# Patient Record
Sex: Female | Born: 1975 | Race: Black or African American | Hispanic: No | Marital: Married | Smoking: Current every day smoker
Health system: Southern US, Community
[De-identification: ages and names within clinical notes are randomized; demographics above are authoritative.]

## PROBLEM LIST (undated history)

## (undated) HISTORY — PX: ROTATOR CUFF REPAIR: SHX139

## (undated) HISTORY — PX: TUBAL LIGATION: SHX77

---

## 1997-12-08 ENCOUNTER — Inpatient Hospital Stay (HOSPITAL_COMMUNITY): Admission: AD | Admit: 1997-12-08 | Discharge: 1997-12-08 | Payer: Self-pay | Admitting: Obstetrics

## 1998-01-27 ENCOUNTER — Inpatient Hospital Stay (HOSPITAL_COMMUNITY): Admission: AD | Admit: 1998-01-27 | Discharge: 1998-01-27 | Payer: Self-pay | Admitting: Obstetrics & Gynecology

## 1998-06-15 ENCOUNTER — Inpatient Hospital Stay (HOSPITAL_COMMUNITY): Admission: AD | Admit: 1998-06-15 | Discharge: 1998-06-15 | Payer: Self-pay | Admitting: *Deleted

## 1998-06-21 ENCOUNTER — Inpatient Hospital Stay (HOSPITAL_COMMUNITY): Admission: AD | Admit: 1998-06-21 | Discharge: 1998-06-21 | Payer: Self-pay | Admitting: *Deleted

## 1998-07-02 ENCOUNTER — Inpatient Hospital Stay (HOSPITAL_COMMUNITY): Admission: AD | Admit: 1998-07-02 | Discharge: 1998-07-04 | Payer: Self-pay | Admitting: Obstetrics

## 1999-04-06 ENCOUNTER — Emergency Department (HOSPITAL_COMMUNITY): Admission: EM | Admit: 1999-04-06 | Discharge: 1999-04-06 | Payer: Self-pay | Admitting: Emergency Medicine

## 1999-08-11 ENCOUNTER — Emergency Department (HOSPITAL_COMMUNITY): Admission: EM | Admit: 1999-08-11 | Discharge: 1999-08-11 | Payer: Self-pay | Admitting: Emergency Medicine

## 2000-09-14 ENCOUNTER — Emergency Department (HOSPITAL_COMMUNITY): Admission: EM | Admit: 2000-09-14 | Discharge: 2000-09-14 | Payer: Self-pay | Admitting: *Deleted

## 2000-11-26 ENCOUNTER — Emergency Department (HOSPITAL_COMMUNITY): Admission: EM | Admit: 2000-11-26 | Discharge: 2000-11-26 | Payer: Self-pay | Admitting: *Deleted

## 2000-12-04 ENCOUNTER — Emergency Department (HOSPITAL_COMMUNITY): Admission: EM | Admit: 2000-12-04 | Discharge: 2000-12-04 | Payer: Self-pay | Admitting: Emergency Medicine

## 2000-12-04 ENCOUNTER — Encounter: Payer: Self-pay | Admitting: Emergency Medicine

## 2001-12-14 ENCOUNTER — Emergency Department (HOSPITAL_COMMUNITY): Admission: EM | Admit: 2001-12-14 | Discharge: 2001-12-15 | Payer: Self-pay

## 2003-01-21 ENCOUNTER — Emergency Department (HOSPITAL_COMMUNITY): Admission: EM | Admit: 2003-01-21 | Discharge: 2003-01-21 | Payer: Self-pay | Admitting: Emergency Medicine

## 2003-01-21 ENCOUNTER — Encounter: Payer: Self-pay | Admitting: Emergency Medicine

## 2003-07-10 ENCOUNTER — Emergency Department (HOSPITAL_COMMUNITY): Admission: EM | Admit: 2003-07-10 | Discharge: 2003-07-10 | Payer: Self-pay | Admitting: Emergency Medicine

## 2003-09-06 ENCOUNTER — Inpatient Hospital Stay (HOSPITAL_COMMUNITY): Admission: AD | Admit: 2003-09-06 | Discharge: 2003-09-06 | Payer: Self-pay | Admitting: Obstetrics & Gynecology

## 2003-12-26 ENCOUNTER — Inpatient Hospital Stay (HOSPITAL_COMMUNITY): Admission: AD | Admit: 2003-12-26 | Discharge: 2003-12-28 | Payer: Self-pay | Admitting: Family Medicine

## 2005-09-28 ENCOUNTER — Emergency Department (HOSPITAL_COMMUNITY): Admission: EM | Admit: 2005-09-28 | Discharge: 2005-09-28 | Payer: Self-pay | Admitting: Emergency Medicine

## 2006-04-09 ENCOUNTER — Inpatient Hospital Stay (HOSPITAL_COMMUNITY): Admission: AD | Admit: 2006-04-09 | Discharge: 2006-04-10 | Payer: Self-pay | Admitting: Family Medicine

## 2006-05-27 ENCOUNTER — Ambulatory Visit (HOSPITAL_COMMUNITY): Admission: RE | Admit: 2006-05-27 | Discharge: 2006-05-27 | Payer: Self-pay | Admitting: Family Medicine

## 2006-07-03 ENCOUNTER — Ambulatory Visit: Payer: Self-pay | Admitting: Internal Medicine

## 2006-08-11 ENCOUNTER — Ambulatory Visit: Payer: Self-pay | Admitting: Neonatology

## 2006-08-11 ENCOUNTER — Ambulatory Visit: Payer: Self-pay | Admitting: Obstetrics & Gynecology

## 2006-08-11 ENCOUNTER — Inpatient Hospital Stay (HOSPITAL_COMMUNITY): Admission: AD | Admit: 2006-08-11 | Discharge: 2006-08-17 | Payer: Self-pay | Admitting: Obstetrics & Gynecology

## 2006-08-15 ENCOUNTER — Encounter (INDEPENDENT_AMBULATORY_CARE_PROVIDER_SITE_OTHER): Payer: Self-pay | Admitting: Specialist

## 2008-06-13 ENCOUNTER — Emergency Department (HOSPITAL_COMMUNITY): Admission: EM | Admit: 2008-06-13 | Discharge: 2008-06-13 | Payer: Self-pay | Admitting: Emergency Medicine

## 2008-06-15 ENCOUNTER — Emergency Department (HOSPITAL_COMMUNITY): Admission: EM | Admit: 2008-06-15 | Discharge: 2008-06-15 | Payer: Self-pay | Admitting: Emergency Medicine

## 2008-06-16 ENCOUNTER — Emergency Department (HOSPITAL_COMMUNITY): Admission: EM | Admit: 2008-06-16 | Discharge: 2008-06-16 | Payer: Self-pay | Admitting: Emergency Medicine

## 2009-03-28 ENCOUNTER — Emergency Department (HOSPITAL_COMMUNITY): Admission: EM | Admit: 2009-03-28 | Discharge: 2009-03-28 | Payer: Self-pay | Admitting: Emergency Medicine

## 2009-03-29 ENCOUNTER — Emergency Department (HOSPITAL_COMMUNITY): Admission: EM | Admit: 2009-03-29 | Discharge: 2009-03-29 | Payer: Self-pay | Admitting: Emergency Medicine

## 2010-12-22 NOTE — Op Note (Signed)
NAME:  Emily Kane, BLOMBERG                ACCOUNT NO.:  [id number]   MEDICAL RECORD NO.:  [id number]          PATIENT TYPE:  INP   LOCATION:  9127                          FACILITY:  [facility]   PHYSICIAN:  Allie Bossier, MD        DATE OF BIRTH:  Jun 17, 1976   DATE OF PROCEDURE:  08/16/2006  DATE OF DISCHARGE:                               OPERATIVE REPORT   PREOPERATIVE DIAGNOSES:  1. Multiparity.  2. Desires permanent sterilization.   POSTOPERATIVE DIAGNOSES:  1. Multiparity.  2. Desires permanent sterilization.   PROCEDURE:  Postpartum bilateral tubal ligation with Filshie clips.   SURGEON:  Allie Bossier, MD   ASSISTANT:  Paticia Stack, MD   ANESTHESIA:  Epidural and local.   COMPLICATIONS:  None.   ESTIMATED BLOOD LOSS:  Minimal.   INDICATIONS FOR PROCEDURE:  This is a 35 year old gravida 5, para 3-1-1-  4, status post spontaneous vaginal delivery, who desires permanent  sterilization.  Risks and benefits of procedure were discussed with the  patient including risk of failure with increased risk of ectopic  gestation if pregnancy occurs.   FINDINGS:  Normal uterus, tubes and ovaries.   PROCEDURE:  The patient was taken to the operating room, where her  epidural was found to be adequate.  A small transverse infraumbilical  skin incision was then made with a scalpel.  The incision was carried  down through the underlying fascia until the peritoneum was identified  and entered.  The peritoneum was noted to be free of any adhesions and  the incision was then extended with the Metzenbaum scissors.  The  patient's right fallopian she was then identified, brought to the  incision and grasped with the Babcock clamp.  The tube was followed out  to the fimbria.  Two Babcock clamps were then used to grasp the tube and  a Filshie clip was placed in the isthmic region of the tube.  About 0.5  mL of 1% lidocaine was injected into the tube on either side of the  Filshie clips.  Excellent  hemostasis was noted and the tube was returned  to the abdomen.  The left fallopian tube was then identified, brought to  the incision and grasped with the Babcock clamps.  The tube was then  followed out to the fimbria.  Two Babcock clamps was then used to grasp  the tube and a Filshie clip placed between the 2 clamps.  0.5 mL of 1%  lidocaine was injected into the tube on either side of the Filshie clip.  Excellent hemostasis was noted and this tube returned to the  abdomen.  The fascia was then closed in a single layer using 3-0 Vicryl.  The skin was closed in a subcuticular fashion using 3-0 Vicryl on a PS2.  The patient tolerated the procedure well.  Sponge, lap and needle counts  were correct x2.  The patient was taken to the recovery room in stable  condition.     ______________________________  Paticia Stack, MD      Allie Bossier, MD  Electronically  Signed    LNJ/MEDQ  D:  08/16/2006  T:  08/16/2006  Job:  [id number]

## 2010-12-22 NOTE — Assessment & Plan Note (Signed)
[facility]                             PULMONARY OFFICE NOTE   Emily, Kane                       MRN:          [id number]  DATE:07/03/2006                            DOB:          [birth year]    REFERRING PHYSICIAN:  Conni Elliot, M.D.   PULMONARY CONSULTATION:   REASON FOR CONSULTATION:  Asthma during pregnancy.   HISTORY:  This 35 year old black female says she has had asthma since  the age of 15, but typically does not need albuterol more than twice  weekly, except when she is exposed to heavy dusts and irritants.  She  now comes in, [redacted] weeks pregnant on her third pregnancy, using albuterol  on the average of two to three times a week with the only thing  different from her previous pregnancies, noticing overt heartburn  symptoms.  She also has mild nasal congestion that she did not have  during her two previous pregnancies.  She denies any excess weight on  this pregnancy versus the other ones, or itching, sneezing, pleuritic or  exertional chest pain, orthopnea, PND or leg swelling.   PAST MEDICAL HISTORY:  Significant for asthma, as noted above, otherwise  healthy.   ALLERGIES:  None known.   MEDICATIONS:  Multivitamins daily and albuterol p.r.n.   SOCIAL HISTORY:  She continues to smoke six cigarettes per day, has also  been exposed to marijuana, describes herself as a homemaker, previously  has worked in Psychiatrist, but now is no longer exposed to irritating  fumes.   FAMILY HISTORY:  Positive for asthma in her two children and allergies  in her whole family.   REVIEW OF SYSTEMS:  Taken in detail on work sheets, again with the  problems outlined above.   PHYSICAL EXAMINATION:  This is a pleasant, ambulatory black female, in  no acute distress with stable vital signs.  HEENT:  Remarkable for minimal turbinate edema with nonspecific  features.  Oropharynx is clear with no evidence of excessive postnasal  drainage  or cobblestoning .  Dentition is intact.  The auditory canals  were clear bilaterally.  NECK:  The neck is supple without cervical adenopathy or tenderness.  Trachea is midline.  Nodes are negative.  LUNG:  Lung fields totally clear bilaterally to auscultation and  percussion.  HEART:  Regular rhythm without murmur, gallop or rub.  No increase in  P2.  ABDOMEN:  Soft, benign, consistent with an intrauterine pregnancy of [redacted]  weeks gestation.  EXTREMITIES:  Warm, without calf tenderness, cyanosis, clubbing or  edema.   Hemoglobin saturation is 100% on room air.   IMPRESSION:  Long-standing asthma with increasing symptoms at [redacted] weeks  gestation, probably partly related to reflux and partly related to  rhinitis, which actually may be, in turn, related to reflux or related  to pregnancy.  Because of the physical effect of the baby below the  diaphragm plus the effects of gestational hormones, she is prone to  reflux and to test the hypothesis of whether or not this is hurting her  asthma, I recommended continuing parenteral  p.r.n., but added Prevacid  30 minutes before meals daily for the next 30 days and avoidance of  cigarettes.  If this corrects the problem and she is able to follow the  rule of twos (which was reviewed with her in detail), we can see her  back here on a p.r.n. basis.  Otherwise, I need to see her in 30 days.   Additional time was spent teaching the patient the rule of twos and  also teaching her optimal MDI technique, which she mastered with  approximately 90% effectiveness.     Charlaine Dalton. Sherene Sires, MD, [facility]  Electronically Signed    MBW/MedQ  DD: 07/03/2006  DT: 07/03/2006  Job #: [id number]   cc:   Conni Elliot, M.D.

## 2011-05-08 LAB — CBC
MCHC: 33.2
RBC: 3.95
RDW: 13.8

## 2011-05-08 LAB — DIFFERENTIAL
Basophils Absolute: 0
Basophils Relative: 0
Monocytes Relative: 8
Neutro Abs: 4
Neutrophils Relative %: 60

## 2011-11-30 ENCOUNTER — Encounter (HOSPITAL_COMMUNITY): Payer: Self-pay | Admitting: Emergency Medicine

## 2011-11-30 ENCOUNTER — Emergency Department (HOSPITAL_COMMUNITY)
Admission: EM | Admit: 2011-11-30 | Discharge: 2011-11-30 | Disposition: A | Discharge status: Home / Self Care | Payer: Worker's Compensation | Attending: Emergency Medicine | Admitting: Emergency Medicine

## 2011-11-30 ENCOUNTER — Emergency Department (HOSPITAL_COMMUNITY): Payer: Worker's Compensation

## 2011-11-30 DIAGNOSIS — M25519 Pain in unspecified shoulder: Secondary | ICD-10-CM | POA: Insufficient documentation

## 2011-11-30 DIAGNOSIS — J45909 Unspecified asthma, uncomplicated: Secondary | ICD-10-CM | POA: Insufficient documentation

## 2011-11-30 DIAGNOSIS — F17200 Nicotine dependence, unspecified, uncomplicated: Secondary | ICD-10-CM | POA: Insufficient documentation

## 2011-11-30 DIAGNOSIS — R209 Unspecified disturbances of skin sensation: Secondary | ICD-10-CM | POA: Diagnosis not present

## 2011-11-30 MED ORDER — HYDROCODONE-ACETAMINOPHEN 5-325 MG PO TABS: 1.0000 | ORAL_TABLET | ORAL | Status: AC | PRN

## 2011-11-30 MED ORDER — OXYCODONE-ACETAMINOPHEN 5-325 MG PO TABS
2.0000 | ORAL_TABLET | Freq: Once | ORAL | Status: AC
  Administered 2011-11-30: 2 via ORAL
  Filled 2011-11-30: qty 2

## 2011-11-30 NOTE — Discharge Instructions (Signed)
Your x-rays today do not show any signs for broken bones or dislocation to the shoulder. At this time your providers to go home and rest your shoulder. Please followup with primary care provider or orthopedic specialist for continued evaluation and treatment.   Shoulder Pain The shoulder is a ball and socket joint. The muscles and tendons (rotator cuff) are what keep the shoulder in its joint and stable. This collection of muscles and tendons holds in the head (ball) of the humerus (upper arm bone) in the fossa (cup) of the scapula (shoulder blade). Today no reason was found for your shoulder pain. Often pain in the shoulder may be treated conservatively with temporary immobilization. For example, holding the shoulder in one place using a sling for rest. Physical therapy may be needed if problems continue. HOME CARE INSTRUCTIONS   Apply ice to the sore area for 15 to 20 minutes, 3 to 4 times per day for the first 2 days. Put the ice in a plastic bag. Place a towel between the bag of ice and your skin.   If you have or were given a shoulder sling and straps, do not remove for as long as directed by your caregiver or until you see a caregiver for a follow-up examination. If you need to remove it to shower or bathe, move your arm as little as possible.   Sleep on several pillows at night to lessen swelling and pain.   Only take over-the-counter or prescription medicines for pain, discomfort, or fever as directed by your caregiver.   Keep any follow-up appointments in order to avoid any type of permanent shoulder disability or chronic pain problems.  SEEK MEDICAL CARE IF:   Pain in your shoulder increases or new pain develops in your arm, hand, or fingers.   Your hand or fingers are colder than your other hand.   You do not obtain pain relief with the medications or your pain becomes worse.  SEEK IMMEDIATE MEDICAL CARE IF:   Your arm, hand, or fingers are numb or tingling.   Your arm, hand, or  fingers are swollen, painful, or turn white or blue.   You develop chest pain or shortness of breath.  MAKE SURE YOU:   Understand these instructions.   Will watch your condition.   Will get help right away if you are not doing well or get worse.  Document Released: 05/02/2005 Document Revised: 07/12/2011 Document Reviewed: 07/07/2011 [facility] Patient Information 2012 [place], [place].    RESOURCE GUIDE  Dental Problems  Patients with Medicaid: [facility] [phone] [address]                                           [phone] [address] Phone:  [phone]                                                  Phone:  [phone]  If unable to pay or uninsured, contact:  [facility] or [facility]. to become qualified for the adult dental clinic.  Chronic Pain Problems Contact [facility]  [phone] Patients need to be referred by their primary care doctor.  Insufficient Money for Medicine Contact [employer] Way:  call "211" or [facility] (985)525-7479.  No Primary Care Doctor Call [facility]  [phone] Other agencies that provide inexpensive medical care    [facility]  [phone]    [facility]  [phone]    [facility]  6707776402    [facility]  [phone]    [facility]  [phone]    [facility]  [phone]  Psychological Services [facility] Behavioral Health  [phone] [facility] Services  [phone] [facility]   7157202168 (emergency services [phone])  Substance Abuse Resources [facility]  [phone] [facility] [phone] [facility] [phone] Floydene Flock [phone] Residential & Outpatient Substance Abuse Program  ([phone]  Abuse/Neglect [facility] Child Abuse Hotline 304-761-2644 [facility] Child Abuse Hotline (626)207-4936 (After  Hours)  Emergency Shelter [facility] 4062945462  Maternity Homes [facility] at [facility] [place] [facility] [phone])362-3301 [facility] [phone]  MRSA Hotline #:   (825)091-2860    [facility] Resources  [facility]     [employer]                          [facility] Dept. [address] [place]                       [address]      [address]  Blondell Reveal Phone:  [phone]                                   Phone:  [phone]                 Phone:  [phone]  [facility] Mental Health Phone:  [phone]  [facility] Child Abuse Hotline (415) [phone] (After Hours)

## 2011-11-30 NOTE — ED Provider Notes (Signed)
History     CSN: [id number]  Arrival date & time 11/30/11  0038   First MD Initiated Contact with Patient 11/30/11 0112      Chief Complaint  Patient presents with  . Shoulder Pain    HPI  History provided by the patient. Patient is a 36 year old female with history of asthma presents complaints of right shoulder pain has been persistent and increasing for the past several months. Pain is worse with certain movements. Patient denies any significant trauma or injury. She has tried using over-the-counter pain medications without significant relief of symptoms. Pain does feel better with rest. Patient also reports pain when rolling of sleeping on side. SHe does report occasional numbness or tingling in hand. Denies neck pain or stiffness. No other aggravating or alleviating factors.   Past Medical History  Diagnosis Date  . Asthma     Past Surgical History  Procedure Date  . Tubal ligation     History reviewed. No pertinent family history.  History  Substance Use Topics  . Smoking status: Current Everyday Smoker -- 0.5 packs/day  . Smokeless tobacco: Not on file  . Alcohol Use: Yes     occasion    OB History    Grav Para Term Preterm Abortions TAB SAB Ect Mult Living                  Review of Systems  Constitutional: Negative for fever and chills.  HENT: Negative for neck pain and neck stiffness.   Gastrointestinal: Negative for nausea and vomiting.  Musculoskeletal: Negative for myalgias, back pain and joint swelling.  Neurological: Positive for numbness. Negative for weakness.    Allergies  Review of patient's allergies indicates no known allergies.  Home Medications  No current outpatient prescriptions on file.  BP 92/50  Pulse 78  Temp(Src) 97.5 F (36.4 C) (Oral)  Resp 16  Ht 5\' 9"  (1.753 m)  Wt 155 lb (70.308 kg)  BMI 22.89 kg/m2  SpO2 98%  LMP 11/07/2011  Physical Exam  Nursing note and vitals reviewed. Constitutional: She is oriented to  person, place, and time. She appears well-developed and well-nourished. No distress.  HENT:  Head: Normocephalic and atraumatic.  Neck: Normal range of motion. Neck supple.       No cervical midline tenderness.  Cardiovascular: Normal rate and regular rhythm.   Pulmonary/Chest: Effort normal and breath sounds normal. No respiratory distress. She has no wheezes. She has no rales.  Abdominal: Soft.  Musculoskeletal: She exhibits tenderness. She exhibits no edema.       Reduced ROM secondary to pain, especially with abduction.  Normal distal radial pulses and cap refill in fingers.    Neurological: She is alert and oriented to person, place, and time.  Skin: Skin is warm and dry. No rash noted.  Psychiatric: She has a normal mood and affect. Her behavior is normal.    ED Course  Procedures   Dg Shoulder Right  11/30/2011  *RADIOLOGY REPORT*  Clinical Data: Right shoulder pain for 4 months.  No injury.  RIGHT SHOULDER - 2+ VIEW  Comparison: None.  Findings: The right shoulder appears intact.  No evidence of acute fracture or subluxation.  No focal bone lesion or bone destruction. No abnormal periosteal reaction.  IMPRESSION: No acute bony abnormalities appreciated.  Original Report Authenticated By: Marlon Pel, M.D.     1. Shoulder pain       MDM  1:20 AM patient seen and evaluated. Patient no  acute distress.        Angus Seller, [place] 11/30/11 [phone]

## 2011-11-30 NOTE — ED Notes (Signed)
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

## 2011-11-30 NOTE — ED Provider Notes (Signed)
Medical screening examination/treatment/procedure(s) were performed by non-physician practitioner and as supervising physician I was immediately available for consultation/collaboration.   Yenty Bloch L Donielle Radziewicz, MD 11/30/11 0736 

## 2012-04-21 ENCOUNTER — Encounter (HOSPITAL_COMMUNITY): Payer: Self-pay | Admitting: Family Medicine

## 2012-04-21 ENCOUNTER — Emergency Department (HOSPITAL_COMMUNITY)
Admission: EM | Admit: 2012-04-21 | Discharge: 2012-04-22 | Discharge status: Against Medical Advice | Payer: Worker's Compensation | Attending: Emergency Medicine | Admitting: Emergency Medicine

## 2012-04-21 DIAGNOSIS — Z202 Contact with and (suspected) exposure to infections with a predominantly sexual mode of transmission: Secondary | ICD-10-CM | POA: Insufficient documentation

## 2012-04-21 LAB — URINALYSIS, ROUTINE W REFLEX MICROSCOPIC
Leukocytes, UA: NEGATIVE
Protein, ur: NEGATIVE mg/dL
Urobilinogen, UA: 2 mg/dL — ABNORMAL HIGH (ref 0.0–1.0)

## 2012-04-21 LAB — PREGNANCY, URINE: Preg Test, Ur: NEGATIVE

## 2012-04-21 NOTE — ED Notes (Signed)
Pt sts that her husband believes she gave him a STDs today, but pt sts she did have bacteria vaginosis this past feb.  Pt sts though she does have abd pain after intercourse.

## 2012-04-21 NOTE — ED Notes (Signed)
Pt states her husband thinks she gave him an STD. Pt states she has not been sleeping with anyone else and denies having any kind of vaginal discharge, itching, or sx of STD. States she just wants to get checked to prove a point.

## 2012-04-22 NOTE — ED Notes (Signed)
Pt called x 1 for room. No response from pt.

## 2012-05-13 ENCOUNTER — Encounter (HOSPITAL_COMMUNITY): Payer: Self-pay | Admitting: Emergency Medicine

## 2012-05-13 ENCOUNTER — Emergency Department (HOSPITAL_COMMUNITY)
Admission: EM | Admit: 2012-05-13 | Discharge: 2012-05-13 | Disposition: A | Discharge status: Home / Self Care | Payer: Worker's Compensation | Attending: Emergency Medicine | Admitting: Emergency Medicine

## 2012-05-13 DIAGNOSIS — F17200 Nicotine dependence, unspecified, uncomplicated: Secondary | ICD-10-CM | POA: Diagnosis not present

## 2012-05-13 DIAGNOSIS — M25552 Pain in left hip: Secondary | ICD-10-CM

## 2012-05-13 DIAGNOSIS — J45909 Unspecified asthma, uncomplicated: Secondary | ICD-10-CM | POA: Insufficient documentation

## 2012-05-13 DIAGNOSIS — M25559 Pain in unspecified hip: Secondary | ICD-10-CM | POA: Diagnosis not present

## 2012-05-13 LAB — CBC
HCT: 38.8 % (ref 36.0–46.0)
Hemoglobin: 13.4 g/dL (ref 12.0–15.0)
MCH: 30.9 pg (ref 26.0–34.0)
MCHC: 34.5 g/dL (ref 30.0–36.0)
MCV: 89.4 fL (ref 78.0–100.0)
Platelets: 255 10*3/uL (ref 150–400)
RBC: 4.34 MIL/uL (ref 3.87–5.11)
RDW: 13.5 % (ref 11.5–15.5)
WBC: 6.6 10*3/uL (ref 4.0–10.5)

## 2012-05-13 LAB — WET PREP, GENITAL
Clue Cells Wet Prep HPF POC: NONE SEEN
Trich, Wet Prep: NONE SEEN
Yeast Wet Prep HPF POC: NONE SEEN

## 2012-05-13 LAB — COMPREHENSIVE METABOLIC PANEL
Albumin: 4 g/dL (ref 3.5–5.2)
BUN: 9 mg/dL (ref 6–23)
Chloride: 99 mEq/L (ref 96–112)
Creatinine, Ser: 0.81 mg/dL (ref 0.50–1.10)
GFR calc Af Amer: >90 mL/min (ref >90)
Total Bilirubin: 0.5 mg/dL (ref 0.3–1.2)
Total Protein: 7.7 g/dL (ref 6.0–8.3)

## 2012-05-13 LAB — COMPREHENSIVE METABOLIC PANEL WITH GFR
ALT: 15 U/L (ref 0–35)
AST: 15 U/L (ref 0–37)
Alkaline Phosphatase: 56 U/L (ref 39–117)
CO2: 27 meq/L (ref 19–32)
Calcium: 9.5 mg/dL (ref 8.4–10.5)
GFR calc non Af Amer: >90 mL/min (ref >90)
Glucose, Bld: 88 mg/dL (ref 70–99)
Potassium: 4.2 meq/L (ref 3.5–5.1)
Sodium: 134 meq/L — ABNORMAL LOW (ref 135–145)

## 2012-05-13 LAB — URINALYSIS, ROUTINE W REFLEX MICROSCOPIC
Bilirubin Urine: NEGATIVE
Glucose, UA: NEGATIVE mg/dL
Hgb urine dipstick: NEGATIVE
Ketones, ur: NEGATIVE mg/dL
Nitrite: NEGATIVE
Protein, ur: NEGATIVE mg/dL
Specific Gravity, Urine: 1.022 (ref 1.005–1.030)
Urobilinogen, UA: 1 mg/dL (ref 0.0–1.0)
pH: 6.5 (ref 5.0–8.0)

## 2012-05-13 LAB — URINE MICROSCOPIC-ADD ON

## 2012-05-13 MED ORDER — HYDROCODONE-ACETAMINOPHEN 5-325 MG PO TABS
2.0000 | ORAL_TABLET | Freq: Once | ORAL | Status: AC
  Administered 2012-05-13: 2 via ORAL
  Filled 2012-05-13: qty 2

## 2012-05-13 MED ORDER — DIAZEPAM 5 MG PO TABS: 5.0000 mg | ORAL_TABLET | Freq: Three times a day (TID) | ORAL | Status: DC | PRN

## 2012-05-13 MED ORDER — KETOROLAC TROMETHAMINE 60 MG/2ML IM SOLN
60.0000 mg | Freq: Once | INTRAMUSCULAR | Status: AC
  Administered 2012-05-13: 60 mg via INTRAMUSCULAR
  Filled 2012-05-13: qty 2

## 2012-05-13 MED ORDER — IBUPROFEN 600 MG PO TABS: 600.0000 mg | ORAL_TABLET | Freq: Three times a day (TID) | ORAL | Status: DC

## 2012-05-13 MED ORDER — HYDROCODONE-ACETAMINOPHEN 5-325 MG PO TABS: ORAL_TABLET | ORAL | Status: DC

## 2012-05-13 NOTE — ED Notes (Signed)
Pt stated that around 4am this morning she started having pain in pelvic area then in left groin area that then radiated to right groin area. Pt works in [employer] and got to work around [employer] and pain has intensified ever since. Pain rate 10/10

## 2012-05-13 NOTE — ED Provider Notes (Signed)
History     CSN: [id number]  Arrival date & time 05/13/12  1329   First MD Initiated Contact with Patient 05/13/12 1528      Chief Complaint  Patient presents with  . 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  Worked fine yesterday.  This early morning when she went back to work, she realized severe pain to groin, both hips worse with movement.  Also with sitting down, had pain going down buttocks.  She denies fall or direct trauma.  No numbness, tingling.  No fevers.  Denies hematuria, dysuria, hesitancy, freq.  Slight vaginal discharge, white, that she didn't fine unusual.  Pt has had 2 menses monthly since September, but has not sought help regarding this.  She is sexually active, doesn't use birth control.  Otherwise healthy.  Has had normal PAP smears in the past. Smokes, drinks alcohol fairly regularly, no drug use.  Has not taken anything specific for pain.    Patient is a 36 y.o. female presenting with groin pain. The history is provided by the patient and a relative.  Groin Pain    Past Medical History  Diagnosis Date  . Asthma     Past Surgical History  Procedure Date  . Tubal ligation     History reviewed. No pertinent family history.  History  Substance Use Topics  . Smoking status: Current Every Day Smoker -- 1.0 packs/day    Types: Cigarettes  . Smokeless tobacco: Not on file  . Alcohol Use: Yes     occasion    OB History    Grav Para Term Preterm Abortions TAB SAB Ect Mult Living                  Review of Systems  Constitutional: Negative for fever and chills.  Gastrointestinal: Negative for nausea, vomiting, diarrhea and constipation.  Genitourinary: Positive for vaginal discharge and menstrual problem. Negative for dysuria, frequency, flank pain, vaginal bleeding, difficulty urinating and vaginal pain.    Musculoskeletal: Positive for arthralgias. Negative for back pain and joint swelling.  All other systems reviewed and are negative.    Allergies  Review of patient's allergies indicates no known allergies.  Home Medications  No current outpatient prescriptions on file.  BP 90/62  Pulse 73  Temp 99.1 F (37.3 C) (Oral)  Resp 16  Ht 5\' 9"  (1.753 m)  Wt 150 lb (68.04 kg)  BMI 22.15 kg/m2  SpO2 100%  LMP 04/28/2012  Physical Exam  Nursing note and vitals reviewed. Constitutional: She is oriented to person, place, and time. She appears well-developed and well-nourished.  HENT:  Head: Normocephalic and atraumatic.  Eyes: Pupils are equal, round, and reactive to light. No scleral icterus.  Neck: Normal range of motion. Neck supple.  Cardiovascular: Normal rate and regular rhythm.   Pulmonary/Chest: Effort normal. No respiratory distress. She has no wheezes.  Abdominal: Soft. She exhibits no distension. There is no tenderness. There is no rebound. Hernia confirmed negative in the right inguinal area and confirmed negative in the left inguinal area.  Genitourinary: There is no lesion on the right labia. There is no lesion on the left labia. Uterus is not tender. Cervix exhibits no discharge and no friability. Right adnexum displays no tenderness. Left adnexum displays no tenderness. No erythema or tenderness around the vagina. No foreign body around the vagina. Vaginal discharge found.  Chaperone present  Musculoskeletal: She exhibits tenderness.       Pelvis is stable, not tender to compression.  Pain at both hips with rotation of both hips as well as flexion.  NO visible deformity, no hernias  Neurological: She is alert and oriented to person, place, and time.  Skin: Skin is warm and dry. No rash noted. No erythema.    ED Course  Procedures (including critical care time)  Labs Reviewed  URINALYSIS, ROUTINE W REFLEX MICROSCOPIC - Abnormal; Notable for the following:     APPearance CLOUDY (*)     Leukocytes, UA TRACE (*)     All other components within normal limits  COMPREHENSIVE METABOLIC PANEL - Abnormal; Notable for the following:    Sodium 134 (*)     All other components within normal limits  WET PREP, GENITAL - Abnormal; Notable for the following:    WBC, Wet Prep HPF POC RARE (*)     All other components within normal limits  CBC  POCT PREGNANCY, URINE  URINE MICROSCOPIC-ADD ON  GC/CHLAMYDIA PROBE AMP, GENITAL   No results found.   1. Hip pain, bilateral     ra sat is 99% and in interpret to be normal    5:32 PM Wet prep is ok.  Cultures sent.  follow up discussed and return precautions discussed.  Pt feels somewhat improved after initial meds.    MDM  Pelvic exam is unremarkable.  Exam suggests musculoskeletal pain.  No fever.  No lumbar or back pain or tenderness on exam.  Will treat with analgesics, muscle relaxants.  Return precautions discussed.  Cultures sent.  No indication for films or U/S based on age, no red flags regarding neurological dysfunction.          Gavin Pound. Quanta Roher, MD 05/13/12 1733

## 2012-05-13 NOTE — Discharge Instructions (Signed)
 Arthralgia Your caregiver has diagnosed you as suffering from an arthralgia. Arthralgia means there is pain in a joint. This can come from many reasons including:  Bruising the joint which causes soreness (inflammation) in the joint.  Wear and tear on the joints which occur as we grow older (osteoarthritis).  Overusing the joint.  Various forms of arthritis.  Infections of the joint. Regardless of the cause of pain in your joint, most of these different pains respond to anti-inflammatory drugs and rest. The exception to this is when a joint is infected, and these cases are treated with antibiotics, if it is a bacterial infection. HOME CARE INSTRUCTIONS   Rest the injured area for as long as directed by your caregiver. Then slowly start using the joint as directed by your caregiver and as the pain allows. Crutches as directed may be useful if the ankles, knees or hips are involved. If the knee was splinted or casted, continue use and care as directed. If an stretchy or elastic wrapping bandage has been applied today, it should be removed and re-applied every 3 to 4 hours. It should not be applied tightly, but firmly enough to keep swelling down. Watch toes and feet for swelling, bluish discoloration, coldness, numbness or excessive pain. If any of these problems (symptoms) occur, remove the ace bandage and re-apply more loosely. If these symptoms persist, contact your caregiver or return to this location.  For the first 24 hours, keep the injured extremity elevated on pillows while lying down.  Apply ice for 15 to 20 minutes to the sore joint every couple hours while awake for the first half day. Then 3 to 4 times per day for the first 48 hours. Put the ice in a plastic bag and place a towel between the bag of ice and your skin.  Wear any splinting, casting, elastic bandage applications, or slings as instructed.  Only take over-the-counter or prescription medicines for pain, discomfort, or  fever as directed by your caregiver. Do not use aspirin immediately after the injury unless instructed by your physician. Aspirin can cause increased bleeding and bruising of the tissues.  If you were given crutches, continue to use them as instructed and do not resume weight bearing on the sore joint until instructed. Persistent pain and inability to use the sore joint as directed for more than 2 to 3 days are warning signs indicating that you should see a caregiver for a follow-up visit as soon as possible. Initially, a hairline fracture (break in bone) may not be evident on X-rays. Persistent pain and swelling indicate that further evaluation, non-weight bearing or use of the joint (use of crutches or slings as instructed), or further X-rays are indicated. X-rays may sometimes not show a small fracture until a week or 10 days later. Make a follow-up appointment with your own caregiver or one to whom we have referred you. A radiologist (specialist in reading X-rays) may read your X-rays. Make sure you know how you are to obtain your X-ray results. Do not assume everything is normal if you do not hear from us . SEEK MEDICAL CARE IF: Bruising, swelling, or pain increases. SEEK IMMEDIATE MEDICAL CARE IF:   Your fingers or toes are numb or blue.  The pain is not responding to medications and continues to stay the same or get worse.  The pain in your joint becomes severe.  You develop a fever over 102 F (38.9 C).  It becomes impossible to move or use the joint.  MAKE SURE YOU:   Understand these instructions.  Will watch your condition.  Will get help right away if you are not doing well or get worse. Document Released: 07/23/2005 Document Revised: 10/15/2011 Document Reviewed: 03/10/2008 [facility] Patient Information 2013 [place], [place].      Narcotic and benzodiazepine use may cause drowsiness, slowed breathing or dependence.  Please use with caution and do not drive, operate machinery  or watch young children alone while taking them.  Taking combinations of these medications or drinking alcohol will potentiate these effects.      PLEASE WATCH SYMPTOMS CAREFULLY.  IF ABDOMINAL PAIN, BACK PAIN, NUMBNESS OR WEAKNESS, FEVERS, VOMITING OR OTHER SIMILAR NEW SYMPTOMS ARE DEVELOPING, PLEASE FOLLOW UP WITH YOUR OWN PHYSICIAN OR RETURN TO THE EMERGENCY DEPARTMENT FOR RE-EVALUATION

## 2012-05-14 LAB — GC/CHLAMYDIA PROBE AMP, GENITAL
Chlamydia, DNA Probe: NEGATIVE
GC Probe Amp, Genital: NEGATIVE

## 2013-05-31 ENCOUNTER — Emergency Department (HOSPITAL_COMMUNITY)
Admission: EM | Admit: 2013-05-31 | Discharge: 2013-05-31 | Disposition: A | Discharge status: Home / Self Care | Payer: Worker's Compensation | Attending: Emergency Medicine | Admitting: Emergency Medicine

## 2013-05-31 ENCOUNTER — Encounter (HOSPITAL_COMMUNITY): Payer: Self-pay | Admitting: Emergency Medicine

## 2013-05-31 DIAGNOSIS — M719 Bursopathy, unspecified: Secondary | ICD-10-CM | POA: Insufficient documentation

## 2013-05-31 DIAGNOSIS — F17200 Nicotine dependence, unspecified, uncomplicated: Secondary | ICD-10-CM | POA: Insufficient documentation

## 2013-05-31 DIAGNOSIS — M25511 Pain in right shoulder: Secondary | ICD-10-CM

## 2013-05-31 DIAGNOSIS — Z791 Long term (current) use of non-steroidal anti-inflammatories (NSAID): Secondary | ICD-10-CM | POA: Insufficient documentation

## 2013-05-31 DIAGNOSIS — J45909 Unspecified asthma, uncomplicated: Secondary | ICD-10-CM | POA: Insufficient documentation

## 2013-05-31 DIAGNOSIS — M67919 Unspecified disorder of synovium and tendon, unspecified shoulder: Secondary | ICD-10-CM | POA: Diagnosis not present

## 2013-05-31 DIAGNOSIS — S46001S Unspecified injury of muscle(s) and tendon(s) of the rotator cuff of right shoulder, sequela: Secondary | ICD-10-CM

## 2013-05-31 DIAGNOSIS — M25519 Pain in unspecified shoulder: Secondary | ICD-10-CM | POA: Diagnosis present

## 2013-05-31 MED ORDER — OXYCODONE-ACETAMINOPHEN 5-325 MG PO TABS
2.0000 | ORAL_TABLET | Freq: Once | ORAL | Status: AC
  Administered 2013-05-31: 2 via ORAL
  Filled 2013-05-31: qty 2

## 2013-05-31 MED ORDER — OXYCODONE-ACETAMINOPHEN 5-325 MG PO TABS: 1.0000 | ORAL_TABLET | ORAL | Status: DC | PRN

## 2013-05-31 NOTE — ED Notes (Signed)
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 Pt is A&O and in NAD

## 2013-05-31 NOTE — ED Provider Notes (Addendum)
TIME SEEN: Right shoulder pain  CHIEF COMPLAINT: Right shoulder pain  HPI: Patient is a 37 y.o. female with a history of asthma and right rotator cuff tendinitis/tear who is being followed by Dr. Ranell Patrick with orthopedics who presents to the emergency department with acute worsening of her right shoulder pain that started yesterday. She states that she has had 2 steroid injections in her shoulder with good relief. She has a very strenuous job working in [employer] and reports she performs lots of lifting, pushing and pulling. Denies any injury that she can remember. She states her pain is burning and radiates throughout her shoulder, worse with movement, better with rest. No numbness, tingling or focal weakness. No fevers, joint swelling, redness or warmth.  ROS: See HPI Constitutional: no fever  Eyes: no drainage  ENT: no runny nose   Cardiovascular:  no chest pain  Resp: no SOB  GI: no vomiting GU: no dysuria Integumentary: no rash  Allergy: no hives  Musculoskeletal: no leg swelling  Neurological: no slurred speech ROS otherwise negative  PAST MEDICAL HISTORY/PAST SURGICAL HISTORY:  Past Medical History  Diagnosis Date  . Asthma     MEDICATIONS:  Prior to Admission medications   Medication Sig Start Date End Date Taking? Authorizing Provider  diazepam (VALIUM) 5 MG tablet Take 1 tablet (5 mg total) by mouth every 8 (eight) hours as needed (muscle spasm). 05/13/12   Gavin Pound. Ghim, MD  HYDROcodone-acetaminophen (NORCO/VICODIN) 5-325 MG per tablet 1-2 tablets po q 6 hours prn moderate to severe pain 05/13/12   Gavin Pound. Ghim, MD  ibuprofen (ADVIL,MOTRIN) 600 MG tablet Take 1 tablet (600 mg total) by mouth 3 (three) times daily. 05/13/12   Gavin Pound. Ghim, MD    ALLERGIES:  No Known Allergies  SOCIAL HISTORY:  History  Substance Use Topics  . Smoking status: Current Every Day Smoker -- 1.00 packs/day    Types: Cigarettes  . Smokeless tobacco: Not on file  . Alcohol Use: Yes    Comment: occasion    FAMILY HISTORY: No family history on file.  EXAM: There were no vitals taken for this visit. CONSTITUTIONAL: Alert and oriented and responds appropriately to questions. Well-appearing; well-nourished HEAD: Normocephalic EYES: Conjunctivae clear, PERRL ENT: normal nose; no rhinorrhea; moist mucous membranes; pharynx without lesions noted NECK: Supple, no meningismus, no LAD; no midline spinal tenderness, stepoff or deformity CARD: RRR; S1 and S2 appreciated; no murmurs, no clicks, no rubs, no gallops RESP: Normal chest excursion without splinting or tachypnea; breath sounds clear and equal bilaterally; no wheezes, no rhonchi, no rales,  ABD/GI: Normal bowel sounds; non-distended; soft, non-tender, no rebound, no guarding BACK:  The back appears normal and is non-tender to palpation, there is no CVA tenderness EXT: Patient is to palpation over her right anterior shoulder diffusely, no tenderness over her clavicle, scapula or biceps tendon; no bony deformity, patient is able to reach across her chest and touch her left shoulder with her right hand; patient is unable to flex her shoulder past 45; normal range of motion in her elbow, wrist, fingers on the right ;normal grip strength, normal sensation to light touch diffusely throughout her upper extremity, 2+ radial pulses bilaterally, Normal ROM in all joints; non-tender to palpation; no edema; normal capillary refill; no cyanosis    SKIN: Normal color for age and race; warm NEURO: Moves all extremities equally PSYCH: The patient's mood and manner are appropriate. Grooming and personal hygiene are appropriate.  MEDICAL DECISION MAKING: Pt here  with acute exacerbation of her rotator cuff tendonitis.  Exam is not consistent with shoulder dislocation, no h/o injury to suspect fracture, no signs of septic joint or cellulitis.  I do not feel patient needs any imaging at this time. We'll give pain medication.  Have instructed  patient to rest her shoulder, apply ice as needed, take anti-inflammatories and followup with Dr. Devonne Doughty. Patient agrees with this plan. Given return precautions.       Layla Maw Analeah Brame, DO 05/31/13 1610  Layla Maw Chana Lindstrom, DO 05/31/13 [id number]

## 2013-05-31 NOTE — ED Notes (Addendum)
Pt has been having rt shoulder pain for 2 yrs now and states that she has seen Irving Copas for this and has had steriod shots in the past. Pain increase this am  No injury

## 2013-06-28 ENCOUNTER — Emergency Department (HOSPITAL_COMMUNITY): Payer: Managed Care, Other (non HMO)

## 2013-06-28 ENCOUNTER — Emergency Department (HOSPITAL_COMMUNITY)
Admission: EM | Admit: 2013-06-28 | Discharge: 2013-06-28 | Disposition: A | Discharge status: Home / Self Care | Payer: Managed Care, Other (non HMO) | Attending: Emergency Medicine | Admitting: Emergency Medicine

## 2013-06-28 ENCOUNTER — Encounter (HOSPITAL_COMMUNITY): Payer: Self-pay | Admitting: Emergency Medicine

## 2013-06-28 DIAGNOSIS — J45909 Unspecified asthma, uncomplicated: Secondary | ICD-10-CM | POA: Insufficient documentation

## 2013-06-28 DIAGNOSIS — F17200 Nicotine dependence, unspecified, uncomplicated: Secondary | ICD-10-CM | POA: Insufficient documentation

## 2013-06-28 DIAGNOSIS — J40 Bronchitis, not specified as acute or chronic: Secondary | ICD-10-CM

## 2013-06-28 DIAGNOSIS — Z79899 Other long term (current) drug therapy: Secondary | ICD-10-CM | POA: Insufficient documentation

## 2013-06-28 DIAGNOSIS — R0602 Shortness of breath: Secondary | ICD-10-CM | POA: Diagnosis present

## 2013-06-28 DIAGNOSIS — Z91040 Latex allergy status: Secondary | ICD-10-CM | POA: Insufficient documentation

## 2013-06-28 LAB — BASIC METABOLIC PANEL
BUN: 10 mg/dL (ref 6–23)
Calcium: 9.7 mg/dL (ref 8.4–10.5)
Chloride: 103 mEq/L (ref 96–112)
GFR calc Af Amer: >90 mL/min (ref >90)
Glucose, Bld: 86 mg/dL (ref 70–99)
Potassium: 3.6 mEq/L (ref 3.5–5.1)

## 2013-06-28 LAB — CBC
HCT: 39.7 % (ref 36.0–46.0)
Hemoglobin: 13.8 g/dL (ref 12.0–15.0)
MCHC: 34.8 g/dL (ref 30.0–36.0)
Platelets: 227 10*3/uL (ref 150–400)
RDW: 13.9 % (ref 11.5–15.5)
WBC: 5.1 10*3/uL (ref 4.0–10.5)

## 2013-06-28 MED ORDER — ALBUTEROL SULFATE (5 MG/ML) 0.5% IN NEBU
5.0000 mg | INHALATION_SOLUTION | Freq: Once | RESPIRATORY_TRACT | Status: AC
  Administered 2013-06-28: 5 mg via RESPIRATORY_TRACT
  Filled 2013-06-28: qty 1

## 2013-06-28 MED ORDER — IPRATROPIUM BROMIDE 0.02 % IN SOLN
0.5000 mg | Freq: Once | RESPIRATORY_TRACT | Status: AC
  Administered 2013-06-28: 0.5 mg via RESPIRATORY_TRACT
  Filled 2013-06-28: qty 2.5

## 2013-06-28 MED ORDER — METHYLPREDNISOLONE SODIUM SUCC 125 MG IJ SOLR
125.0000 mg | Freq: Once | INTRAMUSCULAR | Status: AC
  Administered 2013-06-28: 125 mg via INTRAVENOUS
  Filled 2013-06-28: qty 2

## 2013-06-28 MED ORDER — PREDNISONE 10 MG PO TABS: 20.0000 mg | ORAL_TABLET | Freq: Every day | ORAL | Status: DC

## 2013-06-28 MED ORDER — ALBUTEROL SULFATE HFA 108 (90 BASE) MCG/ACT IN AERS: 1.0000 | INHALATION_SPRAY | Freq: Four times a day (QID) | RESPIRATORY_TRACT | Status: DC | PRN

## 2013-06-28 MED ORDER — AMOXICILLIN 500 MG PO CAPS: 500.0000 mg | ORAL_CAPSULE | Freq: Three times a day (TID) | ORAL | Status: DC

## 2013-06-28 NOTE — ED Provider Notes (Signed)
CSN: [id number]     Arrival date & time 06/28/13  1311 History   First MD Initiated Contact with Patient 06/28/13 1328     Chief Complaint  Patient presents with  . Shortness of Breath   (Consider location/radiation/quality/duration/timing/severity/associated sxs/prior Treatment) Patient is a 37 y.o. female presenting with shortness of breath. The history is provided by the patient (the pt complains of sob and cough).  Shortness of Breath Severity:  Moderate Onset quality:  Gradual Timing:  Constant Progression:  Worsening Chronicity:  Recurrent Context: activity   Associated symptoms: wheezing   Associated symptoms: no abdominal pain, no chest pain, no cough, no headaches and no rash     Past Medical History  Diagnosis Date  . Asthma    Past Surgical History  Procedure Laterality Date  . Tubal ligation    . Rotator cuff repair     History reviewed. No pertinent family history. History  Substance Use Topics  . Smoking status: Current Every Day Smoker -- 1.00 packs/day    Types: Cigarettes  . Smokeless tobacco: Not on file  . Alcohol Use: Yes     Comment: occasion   OB History   Grav Para Term Preterm Abortions TAB SAB Ect Mult Living                 Review of Systems  Constitutional: Negative for appetite change and fatigue.  HENT: Negative for congestion, ear discharge and sinus pressure.   Eyes: Negative for discharge.  Respiratory: Positive for shortness of breath and wheezing. Negative for cough.   Cardiovascular: Negative for chest pain.  Gastrointestinal: Negative for abdominal pain and diarrhea.  Genitourinary: Negative for frequency and hematuria.  Musculoskeletal: Negative for back pain.  Skin: Negative for rash.  Neurological: Negative for seizures and headaches.  Psychiatric/Behavioral: Negative for hallucinations.    Allergies  Latex  Home Medications   Current Outpatient Rx  Name  Route  Sig  Dispense  Refill  . traMADol (ULTRAM) 50 MG  tablet   Oral   Take 50 mg by mouth every 6 (six) hours as needed.         Marland Kitchen albuterol (PROVENTIL HFA;VENTOLIN HFA) 108 (90 BASE) MCG/ACT inhaler   Inhalation   Inhale 1-2 puffs into the lungs every 6 (six) hours as needed for wheezing or shortness of breath.   1 Inhaler   0   . amoxicillin (AMOXIL) 500 MG capsule   Oral   Take 1 capsule (500 mg total) by mouth 3 (three) times daily.   21 capsule   0   . predniSONE (DELTASONE) 10 MG tablet   Oral   Take 2 tablets (20 mg total) by mouth daily.   15 tablet   0    BP 116/71  Pulse 77  Temp(Src) 97.8 F (36.6 C) (Oral)  Resp 20  SpO2 99%  LMP 05/13/2013 Physical Exam  Constitutional: She is oriented to person, place, and time. She appears well-developed.  HENT:  Head: Normocephalic.  Eyes: Conjunctivae and EOM are normal. No scleral icterus.  Neck: Neck supple. No thyromegaly present.  Cardiovascular: Normal rate and regular rhythm.  Exam reveals no gallop and no friction rub.   No murmur heard. Pulmonary/Chest: No stridor. She has wheezes. She has no rales. She exhibits no tenderness.  Abdominal: She exhibits no distension. There is no tenderness. There is no rebound.  Musculoskeletal: Normal range of motion. She exhibits no edema.  Lymphadenopathy:    She has no cervical adenopathy.  Neurological: She is oriented to person, place, and time. She exhibits normal muscle tone. Coordination normal.  Skin: No rash noted. No erythema.  Psychiatric: She has a normal mood and affect. Her behavior is normal.    ED Course  Procedures (including critical care time) Labs Review Labs Reviewed  BASIC METABOLIC PANEL - Abnormal; Notable for the following:    GFR calc non Af Amer 87 (*)    All other components within normal limits  CBC   Imaging Review Dg Chest Port 1 View  06/28/2013   CLINICAL DATA:  Shortness of breath, asthma, wheezing.  EXAM: PORTABLE CHEST - 1 VIEW  COMPARISON:  None.  FINDINGS: The heart size and  mediastinal contours are within normal limits. Both lungs are clear. The visualized skeletal structures are unremarkable.  IMPRESSION: No active disease.   Electronically Signed   By: Charlett Nose M.D.   On: 06/28/2013 13:54    EKG Interpretation    Date/Time:  Sunday June 28 2013 13:19:32 EST Ventricular Rate:  77 PR Interval:  149 QRS Duration: 77 QT Interval:  382 QTC Calculation: 432 R Axis:   68 Text Interpretation:  Sinus rhythm No significant change was found Confirmed by RANCOUR  MD, STEPHEN ([id number]) on 06/28/2013 1:33:06 PM            MDM  Pt improved with tx.  tx amox, pred, albut 1. Asthma   2. Bronchitis        Benny Lennert, MD 06/28/13 [phone]

## 2013-06-28 NOTE — ED Notes (Signed)
C/o SOB x 2days , states does not have inhaler, has not needed for couple of years, also c/o chest pain, cough and congestion

## 2014-05-14 ENCOUNTER — Emergency Department (HOSPITAL_COMMUNITY)
Admission: EM | Admit: 2014-05-14 | Discharge: 2014-05-14 | Disposition: A | Discharge status: Home / Self Care | Payer: Worker's Compensation | Attending: Emergency Medicine | Admitting: Emergency Medicine

## 2014-05-14 ENCOUNTER — Encounter (HOSPITAL_COMMUNITY): Payer: Self-pay | Admitting: Emergency Medicine

## 2014-05-14 DIAGNOSIS — Z792 Long term (current) use of antibiotics: Secondary | ICD-10-CM | POA: Insufficient documentation

## 2014-05-14 DIAGNOSIS — M549 Dorsalgia, unspecified: Secondary | ICD-10-CM | POA: Diagnosis present

## 2014-05-14 DIAGNOSIS — J45909 Unspecified asthma, uncomplicated: Secondary | ICD-10-CM | POA: Diagnosis not present

## 2014-05-14 DIAGNOSIS — Z79899 Other long term (current) drug therapy: Secondary | ICD-10-CM | POA: Insufficient documentation

## 2014-05-14 DIAGNOSIS — M545 Low back pain, unspecified: Secondary | ICD-10-CM

## 2014-05-14 DIAGNOSIS — Z91040 Latex allergy status: Secondary | ICD-10-CM | POA: Insufficient documentation

## 2014-05-14 DIAGNOSIS — Z7951 Long term (current) use of inhaled steroids: Secondary | ICD-10-CM | POA: Insufficient documentation

## 2014-05-14 DIAGNOSIS — Z720 Tobacco use: Secondary | ICD-10-CM | POA: Diagnosis not present

## 2014-05-14 MED ORDER — TRAMADOL HCL 50 MG PO TABS: 50.0000 mg | ORAL_TABLET | Freq: Four times a day (QID) | ORAL | Status: DC | PRN

## 2014-05-14 MED ORDER — HYDROMORPHONE HCL 2 MG/ML IJ SOLN
2.0000 mg | Freq: Once | INTRAMUSCULAR | Status: AC
  Administered 2014-05-14: 2 mg via INTRAMUSCULAR
  Filled 2014-05-14: qty 1

## 2014-05-14 MED ORDER — NAPROXEN 500 MG PO TABS: 500.0000 mg | ORAL_TABLET | Freq: Two times a day (BID) | ORAL | Status: DC

## 2014-05-14 MED ORDER — KETOROLAC TROMETHAMINE 60 MG/2ML IM SOLN
60.0000 mg | Freq: Once | INTRAMUSCULAR | Status: AC
  Administered 2014-05-14: 60 mg via INTRAMUSCULAR
  Filled 2014-05-14: qty 2

## 2014-05-14 MED ORDER — ONDANSETRON 4 MG PO TBDP
4.0000 mg | ORAL_TABLET | Freq: Once | ORAL | Status: AC
  Administered 2014-05-14: 4 mg via ORAL
  Filled 2014-05-14: qty 1

## 2014-05-14 MED ORDER — CYCLOBENZAPRINE HCL 10 MG PO TABS: 10.0000 mg | ORAL_TABLET | Freq: Two times a day (BID) | ORAL | Status: DC | PRN

## 2014-05-14 NOTE — ED Provider Notes (Signed)
Medical screening examination/treatment/procedure(s) were performed by non-physician practitioner and as supervising physician I was immediately available for consultation/collaboration.   EKG Interpretation None        Emily Kane N Nemiah Bubar, DO 05/14/14 0657 

## 2014-05-14 NOTE — ED Provider Notes (Signed)
CSN: [id number]     Arrival date & time 05/14/14  0007 History   First MD Initiated Contact with Patient 05/14/14 [phone]     Chief Complaint  Patient presents with  . Back Pain     (Consider location/radiation/quality/duration/timing/severity/associated sxs/prior Treatment) HPI  Emily Kane 38 year old female who presents to the emergency department with chief complaint of back pain. Patient states that this morning she was leaning over her crib to lay down that maybe when she had sudden onset severe lower back pain on the right side radiating into her buttocks. She complains of pain when standing straight. She denies any radiating pain down the leg. She denies any weakness, loss of bowel or bladder control, paresthesia, or saddle anesthesia. She denies any urinary or vaginal symptoms. She's had similar symptoms in the past with pain such as this. She's concerned because she is alert 3,12 hour shifts this weekend and feels that she can barely stand due to her pain.  Past Medical History  Diagnosis Date  . Asthma    Past Surgical History  Procedure Laterality Date  . Tubal ligation    . Rotator cuff repair     Family History  Problem Relation Age of Onset  . Cancer Other   . Diabetes Other    History  Substance Use Topics  . Smoking status: Current Every Day Smoker -- 1.00 packs/day    Types: Cigarettes  . Smokeless tobacco: Not on file  . Alcohol Use: No     Comment: former   OB History   Grav Para Term Preterm Abortions TAB SAB Ect Mult Living                 Review of Systems  Constitutional: Positive for activity change. Negative for fever and chills.  Respiratory: Negative for shortness of breath.   Cardiovascular: Negative for chest pain.  Gastrointestinal: Negative for nausea, vomiting and abdominal pain.  Genitourinary: Negative for dysuria, hematuria, flank pain and pelvic pain.  Musculoskeletal: Positive for back pain and gait problem. Negative for neck pain  and neck stiffness.  Skin: Negative for rash.  Neurological: Negative for weakness, numbness and headaches.      Allergies  Latex  Home Medications   Prior to Admission medications   Medication Sig Start Date End Date Taking? Authorizing Provider  albuterol (PROVENTIL HFA;VENTOLIN HFA) 108 (90 BASE) MCG/ACT inhaler Inhale 1-2 puffs into the lungs every 6 (six) hours as needed for wheezing or shortness of breath. 06/28/13   Benny LennertJoseph L Zammit, MD  amoxicillin (AMOXIL) 500 MG capsule Take 1 capsule (500 mg total) by mouth 3 (three) times daily. 06/28/13   Benny LennertJoseph L Zammit, MD  predniSONE (DELTASONE) 10 MG tablet Take 2 tablets (20 mg total) by mouth daily. 06/28/13   Benny LennertJoseph L Zammit, MD  traMADol (ULTRAM) 50 MG tablet Take 50 mg by mouth every 6 (six) hours as needed.    Historical Provider, MD   BP 90/63  Pulse 76  Temp(Src) 98.2 F (36.8 C) (Oral)  Resp 18  Ht 5' 9.5" (1.765 m)  Wt 160 lb (72.576 kg)  BMI 23.30 kg/m2  SpO2 100%  LMP 05/14/2014 Physical Exam  Nursing note and vitals reviewed. Constitutional: She is oriented to person, place, and time. She appears well-developed and well-nourished. No distress.  HENT:  Head: Normocephalic and atraumatic.  Eyes: Conjunctivae are normal. No scleral icterus.  Neck: Normal range of motion.  Cardiovascular: Normal rate, regular rhythm and normal heart sounds.  Exam reveals no gallop and no friction rub.   No murmur heard. Pulmonary/Chest: Effort normal and breath sounds normal. No respiratory distress.  Abdominal: Soft. Bowel sounds are normal. She exhibits no distension and no mass. There is no tenderness. There is no guarding.  Musculoskeletal:  No midline spinal tenderness. She's tender to palpation in the right paraspinal muscles and gluteus. Normal deep tendon reflexes, sensation and pulses intact distally. She has full strength and is able to stand however unable to stand erect due to pain.  Neurological: She is alert and  oriented to person, place, and time.  Skin: Skin is warm and dry. She is not diaphoretic.    ED Course  Procedures (including critical care time) Labs Review Labs Reviewed - No data to display  Imaging Review No results found.   EKG Interpretation None      MDM   Final diagnoses:  Right-sided low back pain without sciatica    Patient with back pain.  No neurological deficits and normal neuro exam.  Patient can walk but states is painful.  No loss of bowel or bladder control.  No concern for cauda equina.  No fever, night sweats, weight loss, h/o cancer, IVDU.  RICE protocol and pain medicine indicated and discussed with patient.     Arthor CaptainAbigail March Steyer, PA-C 05/14/14 [phone]

## 2014-05-14 NOTE — ED Notes (Signed)
Pt states she was bending over to lay the baby on the couch and since then she has been having pain in her lower back  Pt states it has happened before  Pt states the pain goes down the right side of her buttock

## 2014-05-14 NOTE — Discharge Instructions (Signed)
SEEK IMMEDIATE MEDICAL ATTENTION IF: New numbness, tingling, weakness, or problem with the use of your arms or legs.  Severe back pain not relieved with medications.  Change in bowel or bladder control.  Increasing pain in any areas of the body (such as chest or abdominal pain).  Shortness of breath, dizziness or fainting.  Nausea (feeling sick to your stomach), vomiting, fever, or sweats.   Back Injury Prevention Back injuries can be extremely painful and difficult to heal. After having one back injury, you are much more likely to experience another later on. It is important to learn how to avoid injuring or re-injuring your back. The following tips can help you to prevent a back injury. PHYSICAL FITNESS  Exercise regularly and try to develop good tone in your abdominal muscles. Your abdominal muscles provide a lot of the support needed by your back.  Do aerobic exercises (walking, jogging, biking, swimming) regularly.  Do exercises that increase balance and strength (tai chi, yoga) regularly. This can decrease your risk of falling and injuring your back.  Stretch before and after exercising.  Maintain a healthy weight. The more you weigh, the more stress is placed on your back. For every pound of weight, 10 times that amount of pressure is placed on the back. DIET  Talk to your caregiver about how much calcium and vitamin D you need per day. These nutrients help to prevent weakening of the bones (osteoporosis). Osteoporosis can cause broken (fractured) bones that lead to back pain.  Include good sources of calcium in your diet, such as dairy products, green, leafy vegetables, and products with calcium added (fortified).  Include good sources of vitamin D in your diet, such as milk and foods that are fortified with vitamin D.  Consider taking a nutritional supplement or a multivitamin if needed.  Stop smoking if you smoke. POSTURE  Sit and stand up straight. Avoid leaning forward  when you sit or hunching over when you stand.  Choose chairs with good low back (lumbar) support.  If you work at a desk, sit close to your work so you do not need to lean over. Keep your chin tucked in. Keep your neck drawn back and elbows bent at a right angle. Your arms should look like the letter "L."  Sit high and close to the steering wheel when you drive. Add a lumbar support to your car seat if needed.  Avoid sitting or standing in one position for too long. Take breaks to get up, stretch, and walk around at least once every hour. Take breaks if you are driving for long periods of time.  Sleep on your side with your knees slightly bent, or sleep on your back with a pillow under your knees. Do not sleep on your stomach. LIFTING, TWISTING, AND REACHING  Avoid heavy lifting, especially repetitive lifting. If you must do heavy lifting:  Stretch before lifting.  Work slowly.  Rest between lifts.  Use carts and dollies to move objects when possible.  Make several small trips instead of carrying 1 heavy load.  Ask for help when you need it.  Ask for help when moving big, awkward objects.  Follow these steps when lifting:  Stand with your feet shoulder-width apart.  Get as close to the object as you can. Do not try to pick up heavy objects that are far from your body.  Use handles or lifting straps if they are available.  Bend at your knees. Squat down, but keep your  heels off the floor.  Keep your shoulders pulled back, your chin tucked in, and your back straight.  Lift the object slowly, tightening the muscles in your legs, abdomen, and buttocks. Keep the object as close to the center of your body as possible.  When you put a load down, use these same guidelines in reverse.  Do not:  Lift the object above your waist.  Twist at the waist while lifting or carrying a load. Move your feet if you need to turn, not your waist.  Bend over without bending at your  knees.  Avoid reaching over your head, across a table, or for an object on a high surface. OTHER TIPS  Avoid wet floors and keep sidewalks clear of ice to prevent falls.  Do not sleep on a mattress that is too soft or too hard.  Keep items that are used frequently within easy reach.  Put heavier objects on shelves at waist level and lighter objects on lower or higher shelves.  Find ways to decrease your stress, such as exercise, massage, or relaxation techniques. Stress can build up in your muscles. Tense muscles are more vulnerable to injury.  Seek treatment for depression or anxiety if needed. These conditions can increase your risk of developing back pain. SEEK MEDICAL CARE IF:  You injure your back.  You have questions about diet, exercise, or other ways to prevent back injuries. MAKE SURE YOU:  Understand these instructions.  Will watch your condition.  Will get help right away if you are not doing well or get worse. Document Released: 08/30/2004 Document Revised: 10/15/2011 Document Reviewed: 09/03/2011 [facility] Patient Information 2015 [place], [place]. This information is not intended to replace advice given to you by your health care provider. Make sure you discuss any questions you have with your health care provider.  Back Pain, Adult Low back pain is very common. About 1 in 5 people have back pain.The cause of low back pain is rarely dangerous. The pain often gets better over time.About half of people with a sudden onset of back pain feel better in just 2 weeks. About 8 in 10 people feel better by 6 weeks.  CAUSES Some common causes of back pain include:  Strain of the muscles or ligaments supporting the spine.  Wear and tear (degeneration) of the spinal discs.  Arthritis.  Direct injury to the back. DIAGNOSIS Most of the time, the direct cause of low back pain is not known.However, back pain can be treated effectively even when the exact cause of the pain is  unknown.Answering your caregiver's questions about your overall health and symptoms is one of the most accurate ways to make sure the cause of your pain is not dangerous. If your caregiver needs more information, he or she may order lab work or imaging tests (X-rays or MRIs).However, even if imaging tests show changes in your back, this usually does not require surgery. HOME CARE INSTRUCTIONS For many people, back pain returns.Since low back pain is rarely dangerous, it is often a condition that people can learn to [facility] their own.   Remain active. It is stressful on the back to sit or stand in one place. Do not sit, drive, or stand in one place for more than 30 minutes at a time. Take short walks on level surfaces as soon as pain allows.Try to increase the length of time you walk each day.  Do not stay in bed.Resting more than 1 or 2 days can delay your recovery.  Do not avoid exercise or work.Your body is made to move.It is not dangerous to be active, even though your back may hurt.Your back will likely heal faster if you return to being active before your pain is gone.  Pay attention to your body when you bend and lift. Many people have less discomfortwhen lifting if they bend their knees, keep the load close to their bodies,and avoid twisting. Often, the most comfortable positions are those that put less stress on your recovering back.  Find a comfortable position to sleep. Use a firm mattress and lie on your side with your knees slightly bent. If you lie on your back, put a pillow under your knees.  Only take over-the-counter or prescription medicines as directed by your caregiver. Over-the-counter medicines to reduce pain and inflammation are often the most helpful.Your caregiver may prescribe muscle relaxant drugs.These medicines help dull your pain so you can more quickly return to your normal activities and healthy exercise.  Put ice on the injured area.  Put ice in a  plastic bag.  Place a towel between your skin and the bag.  Leave the ice on for 15-20 minutes, 03-04 times a day for the first 2 to 3 days. After that, ice and heat may be alternated to reduce pain and spasms.  Ask your caregiver about trying back exercises and gentle massage. This may be of some benefit.  Avoid feeling anxious or stressed.Stress increases muscle tension and can worsen back pain.It is important to recognize when you are anxious or stressed and learn ways to manage it.Exercise is a great option. SEEK MEDICAL CARE IF:  You have pain that is not relieved with rest or medicine.  You have pain that does not improve in 1 week.  You have new symptoms.  You are generally not feeling well. SEEK IMMEDIATE MEDICAL CARE IF:   You have pain that radiates from your back into your legs.  You develop new bowel or bladder control problems.  You have unusual weakness or numbness in your arms or legs.  You develop nausea or vomiting.  You develop abdominal pain.  You feel faint. Document Released: 07/23/2005 Document Revised: 01/22/2012 Document Reviewed: 11/24/2013 [facility] Patient Information 2015 [place], [place]. This information is not intended to replace advice given to you by your health care provider. Make sure you discuss any questions you have with your health care provider.   Emergency Department Resource Guide 1) Find a Doctor and Pay Out of Pocket Although you won't have to find out who is covered by your insurance plan, it is a good idea to ask around and get recommendations. You will then need to call the office and see if the doctor you have chosen will accept you as a new patient and what types of options they offer for patients who are self-pay. Some doctors offer discounts or will set up payment plans for their patients who do not have insurance, but you will need to ask so you aren't surprised when you get to your appointment.  2) Contact Your Local Health  Department Not all health departments have doctors that can see patients for sick visits, but many do, so it is worth a call to see if yours does. If you don't know where your local health department is, you can check in your phone book. The CDC also has a tool to help you locate your state's health department, and many state websites also have listings of all of their local health departments.  3) Find a [facility] If your illness is not likely to be very severe or complicated, you may want to try a walk in clinic. These are popping up all over the country in pharmacies, drugstores, and shopping centers. They're usually staffed by nurse practitioners or physician assistants that have been trained to treat common illnesses and complaints. They're usually fairly quick and inexpensive. However, if you have serious medical issues or chronic medical problems, these are probably not your best option.  No Primary Care Doctor: - Call Health Connect at  [phone] - they can help you locate a primary care doctor that  accepts your insurance, provides certain services, etc. - Physician Referral Service- [phone]  Chronic Pain Problems: Organization         Address  Phone   Notes  [facility]  [phone] Patients need to be referred by their primary care doctor.   Medication Assistance: Organization         Address  Phone   Notes  [facility] Medication [facility] [address] [place], [place] [address] [phone] --Must be a resident of [facility] -- Must have NO insurance coverage whatsoever (no Medicaid/ Medicare, etc.) -- The pt. MUST have a primary care doctor that directs their care regularly and follows them in the community   MedAssist  [phone]   [employer]  [phone]    Agencies that provide inexpensive medical care: Organization         Address  Phone   Notes  [place]  [phone]   [facility]    [phone]   [facility] [place], [place] [address] [phone]   [place] [address], [place] [phone]   Planned Parenthood    [phone]   [facility]    [phone]   [place] and [address], [place] Phone:  [phone], Fax:  [phone] Hours of Operation:  9 am - 6 pm, M-F.  Also accepts Medicaid/Medicare and self-pay.  [facility] for [place] [address], [address], [place] Phone: [phone], Fax: [phone]. Hours of Operation:  8:30 am - 5:30 pm, M-F.  Also accepts Medicaid and self-pay.  [facility] [place] [address], [place] Phone: [phone]   [place], [place], [place] [phone], Ext. [phone] Mondays & Thursdays: 7-9 AM.  First 15 patients are seen on a first come, first serve basis.    [place] Providers:  Organization         Address  Phone   Notes  [facility] [address], [place] [phone] Also accepts self-pay patients.  [facility] [address], [place]  [phone]   [place], [address], [place] [phone]   [facility] [address], [place] [phone]   Lucianne Lei [address], [place]   [phone] Only accepts [place] Access [place] patients after they have their name applied to their card.   Self-Pay (no insurance) in [facility]:  Organization         Address  Phone   Notes  Sickle Cell Patients, [facility] [address]  [place] [phone]   [facility] [place] [phone]   [facility] [place]  [address], [address],  [place] [phone]   [facility]/Dr. Osei-Bonsu  [address] [place] or [address], [place] [phone] Phone number for both [place] and [place] locations is the same.  Urgent Medical and [facility] [place] [address], [place] [phone]   [facility] [address], [place] or [address] [phone]   [facility] [place] [address] [place] [phone], phone; [phone], fax Sees patients 1st and 3rd Saturday of every month.  Must not qualify for public or private insurance (i.e. Medicaid, Medicare, [place] Health Choice, Veterans' Benefits)  Household income should be no more than 200% of the poverty level The clinic cannot treat you if you are pregnant or think you are pregnant  Sexually transmitted diseases are not treated at the clinic.    Dental Care: Organization         Address  Phone  Notes  [facility] Department of [facility] [place] [phone] Accepts children up to age 57 who are enrolled in [place] or [place]; pregnant women with a Medicaid card; and children who have applied for Medicaid or [place] Health Choice, but were declined, whose parents can pay a reduced fee at time of service.  [facility] of [facility]  [address], [place] [phone] Accepts children up to age 51 who are enrolled in [place] or [place]; pregnant women with a Medicaid card; and children who have applied for Medicaid or [place] Health Choice, but were declined, whose parents can pay a reduced fee at time of service.  [facility] Adult [facility] Access PRO[facility]  [place] [phone] Patients are seen by appointment only. Walk-ins are not accepted. [place] will see patients 75 years of age and older. Monday - Tuesday (8am-5pm) Most Wednesdays  (8:30-5pm) $30 per visit, cash only  [facility] Adult Dental Access PROGRAM  [address], [facility] [phone] Patients are seen by appointment only. Walk-ins are not accepted. [place] will see patients 57 years of age and older. One Wednesday Evening (Monthly: Volunteer Based).  $30 per visit, cash only  [place]  [phone] for adults; Children under age 41, call Graduate Pediatric Dentistry at [phone]. Children aged 52-14, please call [phone] to request a pediatric application.  Dental services are provided in all areas of dental care including fillings, crowns and bridges, complete and partial dentures, implants, gum treatment, root canals, and extractions. Preventive care is also provided. Treatment is provided to both adults and children. Patients are selected via a lottery and there is often a waiting list.   [facility] [address], [place]  [phone] [email or web address]   [facility] [address] [place], [place] [phone], Ext. [phone] Second and Fourth Thursday of each month, opens at 6:30 AM; Clinic ends at 9 AM.  Patients are seen on a first-come first-served basis, and a limited number are seen during each clinic.   [facility]  [address] Danker [place], [place] [phone]   Eligibility Requirements You must have lived in [place], [place],  or [place] counties for at least the last three months.   You cannot be eligible for state or federal sponsored [employer], including [employer], [place], or [employer].   You generally cannot be eligible for healthcare insurance through your employer.    How to apply: Eligibility screenings are held every Tuesday and Wednesday afternoon from 1:00 pm until 4:00 pm. You do not need an appointment for the interview!  [facility] [address] [place], [place]   [place]  [place] Department  [place]  [phone]    Behavioral Health Resources in the Community: Intensive Outpatient Programs Organization         Address  Phone  Notes  [place]. [address], [place], [place] [phone]   [facility] Outpatient [address], [place], [place]   ADS: Alcohol & Drug Svcs [address], [place], [place]   [place] [address]  [place], [place] or [phone]   Substance Abuse Resources Organization         Address  Phone  Notes  Alcohol and Drug Services  [phone]   [place]  [phone]   [facility] [place]   Chinita Pester  [phone]   Residential & Outpatient Substance Abuse Program  8658050794   Psychological Services Organization         Address  Phone  Notes  [facility] [place]  [place]  [phone]   [place] [address] [place] or [phone]    Mobile Crisis Teams Organization         Address  Phone  Notes  [facility]atives, Mobile Crisis Care Unit  [phone]   Assertive Psychotherapeutic Services  [address] [place], [place]   [place] Levels [address], [place] [phone]    Self-Help/Support Groups Organization         Address  Phone             Notes  [place]. of [place] - variety of support groups  [place] Call for more information  Narcotics Anonymous (NA), [facility] [address], [employer] [place]  2 meetings at this location   Special educational needs teacher         Address  Phone  Notes  ASAP Residential Treatment [place],    [place]  1-7060487876   [facility] [place]  [address], [place] [phone], [place], [place]   [place], [place] [phone] Admissions: 8am-3pm M-F  Incentives Substance [place] [address],    [place], [place] [phone]   [facility] [address] [place], [place]   [facility] [address],  [place], [place]   [facility]ight Programs - Intensive Outpatient [address], [place], [place]   [facility] ([place].) [place].,  [place], [place] or [phone]   [facility]idential Treatment Services (RTS) [address] [place], [place] Accepts Medicaid  [facility] [place] [address],  [place] [phone] Substance Abuse/Addiction Treatment   [facility] Resources Organization         Address  Phone  Notes  [facility] Services  [phone]   Domenic Schwab, PhD [address] Arlis [place], [place]   [phone] or [phone]   [place], [place] [phone]   [place] [address], [place], [place] [phone] Insurance/Medicaid/sponsorship through [facility] and Families [address]                                    [place], [place] [phone] [place] [address], [place] [phone]    Dr. Adele Schilder  [phone]   [facility] [place] Dept. 1) [address], [place] 2) [place] 3)  [address], [place] [phone]  [phone]   [place] [phone] or [phone] (After Hours)

## 2014-11-29 ENCOUNTER — Encounter (HOSPITAL_COMMUNITY): Payer: Self-pay

## 2014-11-29 ENCOUNTER — Emergency Department (HOSPITAL_COMMUNITY)
Admission: EM | Admit: 2014-11-29 | Discharge: 2014-11-29 | Disposition: A | Discharge status: Home / Self Care | Payer: Worker's Compensation | Attending: Emergency Medicine | Admitting: Emergency Medicine

## 2014-11-29 DIAGNOSIS — Z79899 Other long term (current) drug therapy: Secondary | ICD-10-CM | POA: Diagnosis not present

## 2014-11-29 DIAGNOSIS — Z791 Long term (current) use of non-steroidal anti-inflammatories (NSAID): Secondary | ICD-10-CM | POA: Insufficient documentation

## 2014-11-29 DIAGNOSIS — M545 Low back pain: Secondary | ICD-10-CM

## 2014-11-29 DIAGNOSIS — Z720 Tobacco use: Secondary | ICD-10-CM | POA: Diagnosis not present

## 2014-11-29 DIAGNOSIS — J45909 Unspecified asthma, uncomplicated: Secondary | ICD-10-CM | POA: Diagnosis not present

## 2014-11-29 DIAGNOSIS — Z91040 Latex allergy status: Secondary | ICD-10-CM | POA: Insufficient documentation

## 2014-11-29 MED ORDER — TRAMADOL HCL 50 MG PO TABS: 50.0000 mg | ORAL_TABLET | Freq: Four times a day (QID) | ORAL | Status: DC | PRN

## 2014-11-29 MED ORDER — CYCLOBENZAPRINE HCL 5 MG PO TABS: 5.0000 mg | ORAL_TABLET | Freq: Three times a day (TID) | ORAL | Status: DC | PRN

## 2014-11-29 NOTE — ED Provider Notes (Signed)
CSN: [id number]     Arrival date & time 11/29/14  1210 History  This chart was scribed for non-physician practitioner, Teressa LowerVrinda Nephi Savage, NP, working with Shon Batonourtney F Horton, MD, by Lionel DecemberHatice Demirci, ED Scribe. This patient was seen in room WTR5/WTR5 and the patient's care was started at 12:21 PM.    No chief complaint on file.    (Consider location/radiation/quality/duration/timing/severity/associated sxs/prior Treatment) The history is provided by the patient. No language interpreter was used.    HPI Comments: Emily Kane is a 39 y.o. female who presents to the Emergency Department complaining of lower back pain onset last night. She has associated symptoms pain radiating down both of her legs to her heels.  She has a history of back problems (had a slipped disc in her lower lumbar region) and states that she has pain as the seasons change.  She has not seen anyone yet for her back and would like to be referred to a specialist. She currently denies any numbness/ weakness or incontinence and has not taken any medication to alleviate her symptoms.  Patient was on her period yesterday.  She has no other complaints today.    Past Medical History  Diagnosis Date  . Asthma    Past Surgical History  Procedure Laterality Date  . Tubal ligation    . Rotator cuff repair     Family History  Problem Relation Age of Onset  . Cancer Other   . Diabetes Other    History  Substance Use Topics  . Smoking status: Current Every Day Smoker -- 1.00 packs/day    Types: Cigarettes  . Smokeless tobacco: Not on file  . Alcohol Use: No     Comment: former   OB History    No data available     Review of Systems  Musculoskeletal: Positive for back pain.  All other systems reviewed and are negative.     Allergies  Latex  Home Medications   Prior to Admission medications   Medication Sig Start Date End Date Taking? Authorizing Provider  albuterol (PROVENTIL HFA;VENTOLIN HFA) 108 (90 BASE)  MCG/ACT inhaler Inhale 1-2 puffs into the lungs every 6 (six) hours as needed for wheezing or shortness of breath. 06/28/13   Bethann BerkshireJoseph Zammit, MD  cyclobenzaprine (FLEXERIL) 10 MG tablet Take 1 tablet (10 mg total) by mouth 2 (two) times daily as needed for muscle spasms. 05/14/14   Arthor CaptainAbigail Harris, PA-C  naproxen (NAPROSYN) 500 MG tablet Take 1 tablet (500 mg total) by mouth 2 (two) times daily with a meal. 05/14/14   Arthor CaptainAbigail Harris, PA-C  traMADol (ULTRAM) 50 MG tablet Take 1 tablet (50 mg total) by mouth every 6 (six) hours as needed. 05/14/14   Arthor CaptainAbigail Harris, PA-C   There were no vitals taken for this visit. Physical Exam  Constitutional: She is oriented to person, place, and time. She appears well-developed and well-nourished.  Cardiovascular: Normal rate.   Pulmonary/Chest: Effort normal and breath sounds normal.  Musculoskeletal: Normal range of motion.  Generalized lower back tenderness with palpation. Full rom. Able to do straight leg raises bilaterally. Good sensation and strength bilaterally  Neurological: She is alert and oriented to person, place, and time. She exhibits normal muscle tone. Coordination normal.  Skin: Skin is warm and dry.  Psychiatric: She has a normal mood and affect.  Nursing note and vitals reviewed.   ED Course  Procedures (including critical care time) DIAGNOSTIC STUDIES:    COORDINATION OF CARE: 12:24 PM Discussed treatment plan  with patient at beside, the patient agrees with the plan and has no further questions at this time.   Labs Review Labs Reviewed - No data to display  Imaging Review No results found.   EKG Interpretation None      MDM   Final diagnoses:  Low back pain without sciatica, unspecified back pain laterality    Pt is nuerologically intact. No red flags. Pt given ultram and flexeril for pain and follow up with ortho  I personally performed the services described in this documentation, which was scribed in my presence. The  recorded information has been reviewed and is accurate.    Teressa Lower, NP 11/29/14 1237  Shon Baton, MD 11/30/14 [phone]

## 2014-11-29 NOTE — Discharge Instructions (Signed)
Back Pain, Adult °Back pain is very common. The pain often gets better over time. The cause of back pain is usually not dangerous. Most people can learn to manage their back pain on their own.  °HOME CARE  °· Stay active. Start with short walks on flat ground if you can. Try to walk farther each day. °· Do not sit, drive, or stand in one place for more than 30 minutes. Do not stay in bed. °· Do not avoid exercise or work. Activity can help your back heal faster. °· Be careful when you bend or lift an object. Bend at your knees, keep the object close to you, and do not twist. °· Sleep on a firm mattress. Lie on your side, and bend your knees. If you lie on your back, put a pillow under your knees. °· Only take medicines as told by your doctor. °· Put ice on the injured area. °¨ Put ice in a plastic bag. °¨ Place a towel between your skin and the bag. °¨ Leave the ice on for 15-20 minutes, 03-04 times a day for the first 2 to 3 days. After that, you can switch between ice and heat packs. °· Ask your doctor about back exercises or massage. °· Avoid feeling anxious or stressed. Find good ways to deal with stress, such as exercise. °GET HELP RIGHT AWAY IF:  °· Your pain does not go away with rest or medicine. °· Your pain does not go away in 1 week. °· You have new problems. °· You do not feel well. °· The pain spreads into your legs. °· You cannot control when you poop (bowel movement) or pee (urinate). °· Your arms or legs feel weak or lose feeling (numbness). °· You feel sick to your stomach (nauseous) or throw up (vomit). °· You have belly (abdominal) pain. °· You feel like you may pass out (faint). °MAKE SURE YOU:  °· Understand these instructions. °· Will watch your condition. °· Will get help right away if you are not doing well or get worse. °Document Released: 01/09/2008 Document Revised: 10/15/2011 Document Reviewed: 11/24/2013 °[employer]® Patient Information ©2015 [employer]. This information is not intended  to replace advice given to you by your health care provider. Make sure you discuss any questions you have with your health care provider. ° °

## 2014-11-29 NOTE — ED Notes (Signed)
Pt presents with c/o lower back pain that started last night. Pt reports she has a hx of back pain, no new injury.

## 2015-06-23 ENCOUNTER — Encounter (HOSPITAL_COMMUNITY): Payer: Self-pay | Admitting: Emergency Medicine

## 2015-06-23 ENCOUNTER — Emergency Department (HOSPITAL_COMMUNITY)
Admission: EM | Admit: 2015-06-23 | Discharge: 2015-06-23 | Disposition: A | Discharge status: Home / Self Care | Payer: Worker's Compensation | Attending: Emergency Medicine | Admitting: Emergency Medicine

## 2015-06-23 ENCOUNTER — Emergency Department (HOSPITAL_COMMUNITY): Payer: Worker's Compensation

## 2015-06-23 DIAGNOSIS — R51 Headache: Secondary | ICD-10-CM | POA: Insufficient documentation

## 2015-06-23 DIAGNOSIS — N739 Female pelvic inflammatory disease, unspecified: Secondary | ICD-10-CM | POA: Diagnosis not present

## 2015-06-23 DIAGNOSIS — R519 Headache, unspecified: Secondary | ICD-10-CM

## 2015-06-23 DIAGNOSIS — N83202 Unspecified ovarian cyst, left side: Secondary | ICD-10-CM | POA: Diagnosis not present

## 2015-06-23 DIAGNOSIS — A5901 Trichomonal vulvovaginitis: Secondary | ICD-10-CM | POA: Diagnosis not present

## 2015-06-23 DIAGNOSIS — J45909 Unspecified asthma, uncomplicated: Secondary | ICD-10-CM | POA: Diagnosis not present

## 2015-06-23 DIAGNOSIS — R42 Dizziness and giddiness: Secondary | ICD-10-CM | POA: Diagnosis not present

## 2015-06-23 DIAGNOSIS — R200 Anesthesia of skin: Secondary | ICD-10-CM | POA: Diagnosis not present

## 2015-06-23 DIAGNOSIS — Z91040 Latex allergy status: Secondary | ICD-10-CM | POA: Diagnosis not present

## 2015-06-23 DIAGNOSIS — R109 Unspecified abdominal pain: Secondary | ICD-10-CM | POA: Diagnosis present

## 2015-06-23 DIAGNOSIS — Z9851 Tubal ligation status: Secondary | ICD-10-CM | POA: Insufficient documentation

## 2015-06-23 DIAGNOSIS — Z79899 Other long term (current) drug therapy: Secondary | ICD-10-CM | POA: Insufficient documentation

## 2015-06-23 DIAGNOSIS — R1032 Left lower quadrant pain: Secondary | ICD-10-CM

## 2015-06-23 DIAGNOSIS — Z3202 Encounter for pregnancy test, result negative: Secondary | ICD-10-CM | POA: Diagnosis not present

## 2015-06-23 DIAGNOSIS — F17210 Nicotine dependence, cigarettes, uncomplicated: Secondary | ICD-10-CM | POA: Insufficient documentation

## 2015-06-23 DIAGNOSIS — N730 Acute parametritis and pelvic cellulitis: Secondary | ICD-10-CM

## 2015-06-23 LAB — PREGNANCY, URINE: Preg Test, Ur: NEGATIVE

## 2015-06-23 LAB — URINALYSIS, ROUTINE W REFLEX MICROSCOPIC
BILIRUBIN URINE: NEGATIVE
Glucose, UA: NEGATIVE mg/dL
KETONES UR: NEGATIVE mg/dL
NITRITE: NEGATIVE
PROTEIN: NEGATIVE mg/dL
Specific Gravity, Urine: 1.025 (ref 1.005–1.030)
pH: 5.5 (ref 5.0–8.0)

## 2015-06-23 LAB — WET PREP, GENITAL
Clue Cells Wet Prep HPF POC: NONE SEEN
SPERM: NONE SEEN
Yeast Wet Prep HPF POC: NONE SEEN

## 2015-06-23 LAB — URINE MICROSCOPIC-ADD ON

## 2015-06-23 MED ORDER — AZITHROMYCIN 250 MG PO TABS
1000.0000 mg | ORAL_TABLET | Freq: Once | ORAL | Status: AC
Start: 2015-06-23 — End: 2015-06-23
  Administered 2015-06-23: 1000 mg via ORAL
  Filled 2015-06-23: qty 4

## 2015-06-23 MED ORDER — KETOROLAC TROMETHAMINE 60 MG/2ML IM SOLN
60.0000 mg | Freq: Once | INTRAMUSCULAR | Status: AC
  Administered 2015-06-23: 60 mg via INTRAMUSCULAR
  Filled 2015-06-23: qty 2

## 2015-06-23 MED ORDER — PROCHLORPERAZINE MALEATE 10 MG PO TABS
10.0000 mg | ORAL_TABLET | Freq: Once | ORAL | Status: AC
  Administered 2015-06-23: 10 mg via ORAL
  Filled 2015-06-23: qty 1

## 2015-06-23 MED ORDER — METRONIDAZOLE 500 MG PO TABS: 500.0000 mg | ORAL_TABLET | Freq: Two times a day (BID) | ORAL | Status: AC

## 2015-06-23 MED ORDER — DOXYCYCLINE HYCLATE 100 MG PO CAPS: 100.0000 mg | ORAL_CAPSULE | Freq: Two times a day (BID) | ORAL | Status: AC

## 2015-06-23 MED ORDER — CEFTRIAXONE SODIUM 250 MG IJ SOLR
250.0000 mg | Freq: Once | INTRAMUSCULAR | Status: AC
  Administered 2015-06-23: 250 mg via INTRAMUSCULAR
  Filled 2015-06-23: qty 250

## 2015-06-23 NOTE — ED Provider Notes (Signed)
CSN: [id number]     Arrival date & time 06/23/15  0608 History   First MD Initiated Contact with Patient 06/23/15 [phone]     Chief Complaint  Patient presents with  . Abdominal Pain     (Consider location/radiation/quality/duration/timing/severity/associated sxs/prior Treatment) HPI Comments: HA x 1 month, hx of headaches over years HA like "can't think" need to stop and squeeze temples Feels like swelling, throbbing Recently HA come and goes 5-6 times in one day Notice it at work Nothing makes it better or worse, sleep helps Numbness both fingers when gets HA, no weakness/no dysarthria/aphasia/gait abn/vertigo  LLQ x 3 days 7/10 on left side Comes and goes No diarrhea/const/fevers/v/vaginal bleeding/discharge   Patient is a 39 y.o. female presenting with abdominal pain.  Abdominal Pain Associated symptoms: nausea   Associated symptoms: no chest pain, no constipation, no cough, no diarrhea, no fever, no shortness of breath, no sore throat, no vaginal bleeding, no vaginal discharge and no vomiting     Past Medical History  Diagnosis Date  . Asthma    Past Surgical History  Procedure Laterality Date  . Tubal ligation    . Rotator cuff repair     Family History  Problem Relation Age of Onset  . Cancer Other   . Diabetes Other    Social History  Substance Use Topics  . Smoking status: Current Every Day Smoker -- 1.00 packs/day    Types: Cigarettes  . Smokeless tobacco: None  . Alcohol Use: No     Comment: former   OB History    No data available     Review of Systems  Constitutional: Negative for fever and appetite change.  HENT: Negative for sore throat.   Eyes: Negative for visual disturbance.  Respiratory: Negative for cough and shortness of breath.   Cardiovascular: Negative for chest pain.  Gastrointestinal: Positive for nausea and abdominal pain. Negative for vomiting, diarrhea and constipation.  Genitourinary: Negative for vaginal bleeding, vaginal  discharge and difficulty urinating.  Musculoskeletal: Negative for back pain and neck pain.  Skin: Negative for rash.  Neurological: Positive for light-headedness, numbness and headaches. Negative for dizziness, syncope, facial asymmetry, speech difficulty and weakness.      Allergies  Latex  Home Medications   Prior to Admission medications   Medication Sig Start Date End Date Taking? Authorizing Provider  albuterol (PROVENTIL HFA;VENTOLIN HFA) 108 (90 BASE) MCG/ACT inhaler Inhale 1-2 puffs into the lungs every 6 (six) hours as needed for wheezing or shortness of breath. 06/28/13  Yes Bethann Berkshire, MD  cyclobenzaprine (FLEXERIL) 5 MG tablet Take 1 tablet (5 mg total) by mouth 3 (three) times daily as needed for muscle spasms. Patient not taking: Reported on 06/23/2015 11/29/14   Teressa Lower, NP  doxycycline (VIBRAMYCIN) 100 MG capsule Take 1 capsule (100 mg total) by mouth 2 (two) times daily. 06/23/15 07/07/15  Emily Monday, MD  metroNIDAZOLE (FLAGYL) 500 MG tablet Take 1 tablet (500 mg total) by mouth 2 (two) times daily. 06/23/15 07/07/15  Emily Monday, MD  naproxen (NAPROSYN) 500 MG tablet Take 1 tablet (500 mg total) by mouth 2 (two) times daily with a meal. Patient not taking: Reported on 06/23/2015 05/14/14   Arthor Captain, PA-C  traMADol (ULTRAM) 50 MG tablet Take 1 tablet (50 mg total) by mouth every 6 (six) hours as needed. Patient not taking: Reported on 06/23/2015 11/29/14   Teressa Lower, NP   BP 81/48 mmHg  Pulse 59  Temp(Src) 97.9 F (36.6 C) (Oral)  Resp 16  Ht  (1.753 m)  Wt 230 lb (104.327 kg)  BMI 33.95 kg/m2  SpO2 100%  LMP  (LMP Unknown) Physical Exam  Constitutional: She is oriented to person, place, and time. She appears well-developed and well-nourished. No distress.  HENT:  Head: Normocephalic and atraumatic.  Eyes: Conjunctivae and EOM are normal.  Neck: Normal range of motion.  Cardiovascular: Normal rate, regular rhythm, normal  heart sounds and intact distal pulses.  Exam reveals no gallop and no friction rub.   No murmur heard. Pulmonary/Chest: Effort normal and breath sounds normal. No respiratory distress. She has no wheezes. She has no rales.  Abdominal: Soft. She exhibits no distension. There is no tenderness. There is no guarding.  Genitourinary: There is no tenderness or lesion on the right labia. There is no tenderness or lesion on the left labia. Uterus is tender. Cervix exhibits discharge. Cervix exhibits no motion tenderness. Right adnexum displays no tenderness. Left adnexum displays tenderness. Vaginal discharge found.  Musculoskeletal: She exhibits no edema or tenderness.  Neurological: She is alert and oriented to person, place, and time. She has normal strength. No cranial nerve deficit or sensory deficit. Coordination and gait normal. GCS eye subscore is 4. GCS verbal subscore is 5. GCS motor subscore is 6.  Skin: Skin is warm and dry. No rash noted. She is not diaphoretic. No erythema.  Nursing note and vitals reviewed.   ED Course  Procedures (including critical care time) Labs Review Labs Reviewed  WET PREP, GENITAL - Abnormal; Notable for the following:    Trich, Wet Prep PRESENT (*)    WBC, Wet Prep HPF POC MODERATE (*)    All other components within normal limits  URINALYSIS, ROUTINE W REFLEX MICROSCOPIC (NOT AT [facility]) - Abnormal; Notable for the following:    APPearance CLOUDY (*)    Hgb urine dipstick TRACE (*)    Leukocytes, UA SMALL (*)    All other components within normal limits  URINE MICROSCOPIC-ADD ON - Abnormal; Notable for the following:    Squamous Epithelial / LPF 0-5 (*)    Bacteria, UA FEW (*)    All other components within normal limits  PREGNANCY, URINE  GC/CHLAMYDIA PROBE AMP ([place]) NOT AT [facility]    Imaging Review US Transvaginal Non-ob  06/23/2015  CLINICAL DATA:  Left-sided pelvic pain for 3 days. EXAM: TRANSABDOMINAL AND TRANSVAGINAL ULTRASOUND OF PELVIS  DOPPLER ULTRASOUND OF OVARIES TECHNIQUE: Both transabdominal and transvaginal ultrasound examinations of the pelvis were performed. Transabdominal technique was performed for global imaging of the pelvis including uterus, ovaries, adnexal regions, and pelvic cul-de-sac. It was necessary to proceed with endovaginal exam following the transabdominal exam to visualize the endometrium and ovaries. Color and duplex Doppler ultrasound was utilized to evaluate blood flow to the ovaries. COMPARISON:  None. FINDINGS: Uterus Measurements: 9.0 x 5.5 x 5.1 cm. No fibroids or other mass visualized. Endometrium Thickness: 12.4 mm which is within normal limits for patient of reproductive age. No focal abnormality visualized. Right ovary Measurements: 3.6 x 3.1 x 2.0 cm. 2.8 cm follicular cyst is noted. Left ovary Measurements: 3.5 x 3.1 x 2.4 cm. Mildly complex 2.4 cm cyst is noted which may represent hemorrhagic cyst. Pulsed Doppler evaluation of both ovaries demonstrates normal low-resistance arterial and venous waveforms. Other findings None. IMPRESSION: 2.4 cm mildly complex left ovarian cyst is noted most consistent with small hemorrhagic cyst. No other abnormality seen in the pelvis. Electronically Signed   By: Lupita Raider,  M.D.   On: 06/23/2015 08:36   US Pelvis Complete  06/23/2015  CLINICAL DATA:  Left-sided pelvic pain for 3 days. EXAM: TRANSABDOMINAL AND TRANSVAGINAL ULTRASOUND OF PELVIS DOPPLER ULTRASOUND OF OVARIES TECHNIQUE: Both transabdominal and transvaginal ultrasound examinations of the pelvis were performed. Transabdominal technique was performed for global imaging of the pelvis including uterus, ovaries, adnexal regions, and pelvic cul-de-sac. It was necessary to proceed with endovaginal exam following the transabdominal exam to visualize the endometrium and ovaries. Color and duplex Doppler ultrasound was utilized to evaluate blood flow to the ovaries. COMPARISON:  None. FINDINGS: Uterus Measurements:  9.0 x 5.5 x 5.1 cm. No fibroids or other mass visualized. Endometrium Thickness: 12.4 mm which is within normal limits for patient of reproductive age. No focal abnormality visualized. Right ovary Measurements: 3.6 x 3.1 x 2.0 cm. 2.8 cm follicular cyst is noted. Left ovary Measurements: 3.5 x 3.1 x 2.4 cm. Mildly complex 2.4 cm cyst is noted which may represent hemorrhagic cyst. Pulsed Doppler evaluation of both ovaries demonstrates normal low-resistance arterial and venous waveforms. Other findings None. IMPRESSION: 2.4 cm mildly complex left ovarian cyst is noted most consistent with small hemorrhagic cyst. No other abnormality seen in the pelvis. Electronically Signed   By: Lupita Raider, M.D.   On: 06/23/2015 08:36   [place] Art/ven Flow Abd Pelv Doppler  06/23/2015  CLINICAL DATA:  Left-sided pelvic pain for 3 days. EXAM: TRANSABDOMINAL AND TRANSVAGINAL ULTRASOUND OF PELVIS DOPPLER ULTRASOUND OF OVARIES TECHNIQUE: Both transabdominal and transvaginal ultrasound examinations of the pelvis were performed. Transabdominal technique was performed for global imaging of the pelvis including uterus, ovaries, adnexal regions, and pelvic cul-de-sac. It was necessary to proceed with endovaginal exam following the transabdominal exam to visualize the endometrium and ovaries. Color and duplex Doppler ultrasound was utilized to evaluate blood flow to the ovaries. COMPARISON:  None. FINDINGS: Uterus Measurements: 9.0 x 5.5 x 5.1 cm. No fibroids or other mass visualized. Endometrium Thickness: 12.4 mm which is within normal limits for patient of reproductive age. No focal abnormality visualized. Right ovary Measurements: 3.6 x 3.1 x 2.0 cm. 2.8 cm follicular cyst is noted. Left ovary Measurements: 3.5 x 3.1 x 2.4 cm. Mildly complex 2.4 cm cyst is noted which may represent hemorrhagic cyst. Pulsed Doppler evaluation of both ovaries demonstrates normal low-resistance arterial and venous waveforms. Other findings None.  IMPRESSION: 2.4 cm mildly complex left ovarian cyst is noted most consistent with small hemorrhagic cyst. No other abnormality seen in the pelvis. Electronically Signed   By: Lupita Raider, M.D.   On: 06/23/2015 08:36   I have personally reviewed and evaluated these images and lab results as part of my medical decision-making.   EKG Interpretation None      MDM   Final diagnoses:  Left lower quadrant pain  Left ovarian cyst  PID (acute pelvic inflammatory disease)  Trichomonas vaginitis  Acute nonintractable headache, unspecified headache type   39 year old female with history of asthma presents with concern for left lower quadrant abdominal pain for 3 days and headache for one month.  Regarding patient's headache, and history is not consistent with subarachnoid hemorrhage, meningitis, patient has no history of trauma and normal neurologic exam.  History of throbbing headache worse during the day at work this most consistent with a tension-type headache, however recommend CT follow-up if headaches continue.    Regarding patient's left lower quadrant abdominal pain, have low suspicion for diverticulitis given no diarrhea, no nausea, no fever or  history of prior.  Pelvic exam shows moderate amount of discharge, left adnexal and mild uterine tenderness. US ordered which showed no signs of torsion, no signs of abscess.  pregnancy test negative and urinalysis was not concerning for UTI. Patient was given Rocephin, azithromycin in the emergency department and 14 days of Flagyl and doxycycline for concern of possible PID. Recommended PCP follow-up as well as follow-up with OB/GYN given presence of left ovarian cyst and concern of lack of menses for 2 months.    Emily MondayErin Shabre Kreher, MD 06/23/15 1007

## 2015-06-23 NOTE — ED Notes (Signed)
Patient transported to Ultrasound 

## 2015-06-23 NOTE — Discharge Instructions (Signed)
°Emergency Department Resource Guide °1) Find a Doctor and Pay Out of Pocket °Although you won't have to find out who is covered by your insurance plan, it is a good idea to ask around and get recommendations. You will then need to call the office and see if the doctor you have chosen will accept you as a new patient and what types of options they offer for patients who are self-pay. Some doctors offer discounts or will set up payment plans for their patients who do not have insurance, but you will need to ask so you aren't surprised when you get to your appointment. ° °2) Contact Your Local Health Department °Not all health departments have doctors that can see patients for sick visits, but many do, so it is worth a call to see if yours does. If you don't know where your local health department is, you can check in your phone book. The CDC also has a tool to help you locate your state's health department, and many state websites also have listings of all of their local health departments. ° °3) Find a Walk-in Clinic °If your illness is not likely to be very severe or complicated, you may want to try a walk in clinic. These are popping up all over the country in pharmacies, drugstores, and shopping centers. They're usually staffed by nurse practitioners or physician assistants that have been trained to treat common illnesses and complaints. They're usually fairly quick and inexpensive. However, if you have serious medical issues or chronic medical problems, these are probably not your best option. ° °No Primary Care Doctor: °- Call [facility] at  832-8000 - they can help you locate a primary care doctor that  accepts your insurance, provides certain services, etc. °- Physician Referral Service- 1-800-533-3463 ° °Chronic Pain Problems: °Organization         Address  Phone   Notes  °[facility]  (336) 297-2271 Patients need to be referred by their primary care doctor.  ° °Medication  Assistance: °Organization         Address  Phone   Notes  °[place] [facility] [address] °[place], [place] [address] (336) 641-8030 --Must be a resident of [place] °-- Must have NO insurance coverage whatsoever (no Medicaid/ Medicare, etc.) °-- The pt. MUST have a primary care doctor that directs their care regularly and follows them in the community °  °[facility]  (866) 331-1348   °[employer]  (888) 892-1162   ° °Agencies that provide inexpensive medical care: °Organization         Address  Phone   Notes  °[facility]  (336) 832-8035   °[facility]    (336) 832-7272   °[facility] [address] °[place], [place] [address] (336) 832-4777   °[facility] [address], °[place] (336) 271-4999   °[facility]    (336) 373-0678   °[facility]    (336) 272-1050   °[facility] ° [address], [place] Phone:  (336) 832-4444, Fax:  (336) 832-4440 Hours of Operation:  9 am - 6 pm, M-F.  Also accepts Medicaid/Medicare and self-pay.  °[facility] ° [address], [place] Phone: (336) 832-3150, Fax: (336) 832-3151. Hours of Operation:  8:30 am - 5:30 pm, M-F.  Also accepts Medicaid and self-pay.  °[facility] [place] [address], [place] Phone: (336) 878-6027   °[facility] [address], [place], [place] (336)723-1848, Ext. 123 Mondays & Thursdays: 7-9 AM.  First 15 patients are seen on a first come, first serve basis. °  ° °Medicaid-accepting [place] Providers: ° °Organization         Address  Phone   Notes  °[facility] [address], [place] (336) 641-2100 Also accepts self-pay patients.  °[facility] [address], [place] ° (336) 856-9996   °[facility] [address], [place]  (336) 288-8857   °[facility] [address] [place] [address], [place] (336) 299-7000   °Veita Bland [address], [place]  ° (336) 373-1557 Only accepts [place] Access Medicaid patients after they have their name applied to their card.  ° °Self-Pay (no insurance) in [place]: ° °Organization         Address  Phone   Notes  °Sickle Cell Patients, [facility] [address], [place] (336) 832-1970   °[facility] [address], [place] (336) 832-4400   °[facility] ° [address], [place] (336) 992-4800   °[facility]/Dr. Osei-Bonsu ° [address] [place] [address], [place] or [address], [place] (336) 841-8500 Phone number for both [place] and [place] locations is the same.  °[facility] [address], [place] (336) 299-0000   °[facility] [address] [place] [address], [place] or [address] (336) 852-7530 °(336) 878-2260   °[facility] [address], [place] (336) 350-1642, phone; (336) 294-5005, fax Sees patients 1st and 3rd Saturday of every month.  Must not qualify for public or private insurance (i.e. Medicaid, Medicare, [place] Health Choice, Veterans' Benefits) • Household income should be no more than 200% of the poverty level •The clinic cannot treat you if you are pregnant or think you are pregnant • Sexually transmitted diseases are not treated at the clinic.  ° ° °Dental Care: °Organization         Address  Phone  Notes  °[place] [facility] [address], [place] (336) 641-6152 Accepts children up to age 21 who are enrolled in Medicaid or [place] Health Choice; pregnant women with a Medicaid card; and children who have applied for Medicaid or [place] Health Choice, but were declined, whose parents can pay a reduced fee at time of service.  °[place]  [facility] [place]  [address], [place] (336) 641-7733 Accepts children up to age 21 who are enrolled in Medicaid or [place] Health Choice; pregnant women with a Medicaid card; and children who have applied for Medicaid or [place] Health Choice, but were declined, whose parents can pay a reduced fee at time of service.  °[facility] ° [address], [place] (336) 641-4533 Patients are seen by appointment only. Walk-ins are not accepted. [facility] will see patients 18 years of age and older. °Monday - Tuesday (8am-5pm) °Most Wednesdays (8:30-5pm) °$30 per visit, cash only  °[facility] ° [address], [place] (336) 641-4533 Patients are seen by appointment only. Walk-ins are not accepted. [facility] will see patients 18 years of age and older. °One   Wednesday Evening (Monthly: Volunteer Based).  $30 per visit, cash only  °[facility]  (919) 537-3737 for adults; Children under age 4, call Graduate Pediatric Dentistry at (919) [phone]. Children aged 4-14, please call (919) [phone] to request a pediatric application. ° Dental services are provided in all areas of dental care including fillings, crowns and bridges, complete and partial dentures, implants, gum treatment, root canals, and extractions. Preventive care is also provided. Treatment is provided to both adults and children. °Patients are selected via a lottery and there is often a waiting list. °  °[facility] [address], °[place] ° (336) 763-8833 [email or web address] °  °[facility] [address], [place], [place] (336)723-1848, Ext. 123 Second and Fourth Thursday of each month, opens at 6:30 AM; Clinic ends at 9 AM.  Patients are seen on a first-come first-served basis, and a limited number are seen during each clinic.  ° °[facility] ° [address], [place], [place] (336) 723-7904    Eligibility Requirements °You must have lived in [place], [place], or [place] counties for at least the last three months. °  You cannot be eligible for state or federal sponsored healthcare insurance, including Veterans Administration, Medicaid, or Medicare. °  You generally cannot be eligible for healthcare insurance through your employer.  °  How to apply: °Eligibility screenings are held every Tuesday and Wednesday afternoon from 1:00 pm until 4:00 pm. You do not need an appointment for the interview!  °[facility] [address], [place], [place] 336-631-2330   °[facility]  336-342-8273   °[facility]  336-703-3100   °[facility]  336-570-6415   ° °Behavioral Health Resources in the Community: °Intensive Outpatient Programs °Organization         Address  Phone  Notes  °[facility] [address], [place], [place] 336-878-6098   °[facility] [address], [place], [place] 336-832-9800   °[facility] [address], [place], [place] ° 336-882-2125   °[facility] [address],  °[place], [place] 1-800-853-5163 or 336-641-4981   °Substance Abuse Resources °Organization         Address  Phone  Notes  °[facility]  336-882-2125   °[facility]  336-784-9470   °[facility]  336-285-9073   °[facility]  336-845-3988   °Residential & Outpatient Substance Abuse Program  1-800-659-3381   °Psychological Services °Organization         Address  Phone  Notes  °[facility]  336- 832-9600   °[facility]  336- 378-7881   °[facility] [address], [place] 1-800-853-5163 or 336-641-4981   ° °Mobile Crisis Teams °Organization         Address  Phone  Notes  °[facility], Mobile Crisis Care Unit  1-877-626-1772   °Assertive °[facility] ° [address]  [place], [place] 336-834-9664   °Sharon DeEsch [address] °[place] 336-554-5454   ° °Self-Help/Support Groups °Organization         Address  Phone             Notes  °[facility] - variety of support groups  336- 373-1402 Call for more information  °Narcotics Anonymous (NA), [facility] [address], °[place]  2 meetings at this location  ° °  Residential Treatment Programs °Organization         Address  Phone  Notes  °[facility] [address],    °[place]  1-866-801-8205   °[facility] ° [address], [place], [place] 704-293-8524   °[facility] [address], [place] 336-845-3988 Admissions: 8am-3pm M-F  °[facility] [address]    °[place], [place] 336-841-1104   °[facility] [address], [place], [place] 336-379-7146   °[facility] [address]  °[place], [place] 336-285-9073   °[facility] Intensive Outpatient [address], [place], [place] 336-852-3033   °[facility] [address]  °[place], [place] 1-877-615-2722 or 336-784-9470   °[facility] [address] [place], [place] 336-227-7417 Accepts Medicaid  °[facility] [address]  ° [place] 1-800-659-3381 Substance Abuse/Addiction Treatment  ° °[place] Behavioral Health Resources °Organization         Address  Phone  Notes  °[facility]  (888) 581-9988   °Julie Brannon, PhD [address] [place], [place]   (336) 349-5553 or (336) 951-0000   °[facility]   [address] °[place], [place] (336) 349-4454   °[facility] [address], [place], [place] (336) 342-8316 Insurance/Medicaid/sponsorship through [facility]  °[facility] [address]                                    [place], [place] (336) 342-8316 Therapy/tele-psych/case    °[facility] [address]  ° [place], [place] (336) 349-2233    °Dr. Arfeen  (336) 349-4544   °[facility] [place]  [employer] [place] [facility] 1) [address], [place] °2) [address], [place] °3)  [address], [place] (336) 349-3220 °(336) 342-7768 ° °(336) 342-8140   °[place] [facility] (336) 342-1394 or (336) 342-3537 (After Hours)    ° ° °

## 2015-06-23 NOTE — ED Notes (Signed)
Pt c/o LLQ pain x 3 days with nausea last night. +migraine for 1 month. Pt states she has not had period in 2 months but has had tubal.

## 2015-06-24 LAB — GC/CHLAMYDIA PROBE AMP (~~LOC~~) NOT AT ARMC
Chlamydia: NEGATIVE
Neisseria Gonorrhea: NEGATIVE

## 2016-01-08 ENCOUNTER — Encounter (HOSPITAL_COMMUNITY): Payer: Self-pay | Admitting: Emergency Medicine

## 2016-01-08 ENCOUNTER — Emergency Department (HOSPITAL_COMMUNITY): Payer: Worker's Compensation

## 2016-01-08 ENCOUNTER — Emergency Department (HOSPITAL_COMMUNITY)
Admission: EM | Admit: 2016-01-08 | Discharge: 2016-01-08 | Disposition: A | Discharge status: Home / Self Care | Payer: Worker's Compensation | Attending: Emergency Medicine | Admitting: Emergency Medicine

## 2016-01-08 DIAGNOSIS — F17210 Nicotine dependence, cigarettes, uncomplicated: Secondary | ICD-10-CM | POA: Insufficient documentation

## 2016-01-08 DIAGNOSIS — J4520 Mild intermittent asthma, uncomplicated: Secondary | ICD-10-CM | POA: Insufficient documentation

## 2016-01-08 DIAGNOSIS — Z91040 Latex allergy status: Secondary | ICD-10-CM | POA: Diagnosis not present

## 2016-01-08 DIAGNOSIS — R05 Cough: Secondary | ICD-10-CM | POA: Diagnosis present

## 2016-01-08 DIAGNOSIS — R059 Cough, unspecified: Secondary | ICD-10-CM

## 2016-01-08 MED ORDER — IPRATROPIUM BROMIDE 0.02 % IN SOLN
0.5000 mg | Freq: Once | RESPIRATORY_TRACT | Status: AC
  Administered 2016-01-08: 0.5 mg via RESPIRATORY_TRACT
  Filled 2016-01-08: qty 2.5

## 2016-01-08 MED ORDER — PREDNISONE 20 MG PO TABS: ORAL_TABLET | ORAL | Status: DC

## 2016-01-08 MED ORDER — BENZONATATE 100 MG PO CAPS: 100.0000 mg | ORAL_CAPSULE | Freq: Three times a day (TID) | ORAL | Status: DC

## 2016-01-08 MED ORDER — PREDNISONE 20 MG PO TABS
60.0000 mg | ORAL_TABLET | Freq: Once | ORAL | Status: AC
  Administered 2016-01-08: 60 mg via ORAL
  Filled 2016-01-08: qty 3

## 2016-01-08 MED ORDER — ALBUTEROL SULFATE (2.5 MG/3ML) 0.083% IN NEBU
5.0000 mg | INHALATION_SOLUTION | Freq: Once | RESPIRATORY_TRACT | Status: AC
  Administered 2016-01-08: 5 mg via RESPIRATORY_TRACT
  Filled 2016-01-08: qty 6

## 2016-01-08 MED ORDER — ALBUTEROL SULFATE HFA 108 (90 BASE) MCG/ACT IN AERS
2.0000 | INHALATION_SPRAY | RESPIRATORY_TRACT | Status: DC | PRN
  Administered 2016-01-08: 2 via RESPIRATORY_TRACT
  Filled 2016-01-08: qty 6.7

## 2016-01-08 NOTE — ED Notes (Signed)
She states she feels much better.  Wheezes no longer audible and are faint bilat. Per auscultation bilat.

## 2016-01-08 NOTE — Discharge Instructions (Signed)
Take the prescribed medication as directed. °Follow-up with your primary care doctor. °Return to the ED for new or worsening symptoms. °

## 2016-01-08 NOTE — ED Notes (Signed)
Pt c/o nasal congestion and productive cough x 2 weeks. Hx of asthma. Pt reports taking albuterol with no relief.

## 2016-01-08 NOTE — ED Provider Notes (Signed)
CSN: [id number]     Arrival date & time 01/08/16  1049 History   First MD Initiated Contact with Emily Kane 01/08/16 1103     Chief Complaint  Emily Kane presents with  . Nasal Congestion  . Cough     (Consider location/radiation/quality/duration/timing/severity/associated sxs/prior Treatment) Emily Kane is a 40 y.o. female presenting with cough. The history is provided by the Emily Kane and medical records.  Cough  40 y.o. F with hx of asthma, presenting to the ED for nasal congestion and cough.  Emily Kane reports Emily Kane has had a wet, intermittently productive cough for the past 2 weeks.  States when cough is productive, sputum is yellow/green in color and thick.  Emily Kane reports SOB and increased wheezing, worse at night time.  Denies chest pain.  Does have hx of asthma, states Emily Kane has not had any medication at home in quite some time.  Reports subjective fever, no chills or sweats.  No sick contacts.  Emily Kane is a currently daily smoker.  VSS.  Past Medical History  Diagnosis Date  . Asthma    Past Surgical History  Procedure Laterality Date  . Tubal ligation    . Rotator cuff repair     Family History  Problem Relation Age of Onset  . Cancer Other   . Diabetes Other    Social History  Substance Use Topics  . Smoking status: Current Every Day Smoker -- 1.00 packs/day    Types: Cigarettes  . Smokeless tobacco: None  . Alcohol Use: No     Comment: former   OB History    No data available     Review of Systems  HENT: Positive for congestion.   Respiratory: Positive for cough.   All other systems reviewed and are negative.     Allergies  Latex  Home Medications   Prior to Admission medications   Medication Sig Start Date End Date Taking? Authorizing Provider  albuterol (PROVENTIL HFA;VENTOLIN HFA) 108 (90 BASE) MCG/ACT inhaler Inhale 1-2 puffs into the lungs every 6 (six) hours as needed for wheezing or shortness of breath. 06/28/13   Bethann Berkshire, MD  naproxen (NAPROSYN) 500 MG  tablet Take 1 tablet (500 mg total) by mouth 2 (two) times daily with a meal. Emily Kane not taking: Reported on 06/23/2015 05/14/14   Arthor Captain, PA-C   BP 100/67 mmHg  Pulse 90  Temp(Src) 98.1 F (36.7 C) (Oral)  Resp 16  Ht  (1.753 m)  Wt 97.523 kg  BMI 31.74 kg/m2  SpO2 99%  LMP 01/08/2016   Physical Exam  Constitutional: Emily Kane is oriented to person, place, and time. Emily Kane appears well-developed and well-nourished. No distress.  HENT:  Head: Normocephalic and atraumatic.  Right Ear: Tympanic membrane and ear canal normal.  Left Ear: Tympanic membrane and ear canal normal.  Nose: Mucosal edema present.  Mouth/Throat: Uvula is midline, oropharynx is clear and moist and mucous membranes are normal. No oropharyngeal exudate, posterior oropharyngeal edema, posterior oropharyngeal erythema or tonsillar abscesses.  + nasal congestion  Eyes: Conjunctivae and EOM are normal. Pupils are equal, round, and reactive to light.  Neck: Normal range of motion.  Cardiovascular: Normal rate, regular rhythm and normal heart sounds.   Pulmonary/Chest: No respiratory distress. Emily Kane has wheezes. Emily Kane has rhonchi.  Intermixed wheezes and rhonchi throughout, some increased work of breathing but no acute distress, able to speak in short sentences without difficulty, O2 sats 99% on RA  Abdominal: Soft. Bowel sounds are normal.  Musculoskeletal: Normal range of motion.  Neurological: Emily Kane is alert and oriented to person, place, and time.  Skin: Skin is warm and dry. Emily Kane is not diaphoretic.  Psychiatric: Emily Kane has a normal mood and affect.  Nursing note and vitals reviewed.   ED Course  Procedures (including critical care time) Labs Review Labs Reviewed - No data to display  Imaging Review Dg Chest 2 View  01/08/2016  CLINICAL DATA:  Productive cough for 2 weeks EXAM: CHEST  2 VIEW COMPARISON:  06/28/2013 FINDINGS: Normal heart size. There is no pleural effusion or edema. There is no airspace  consolidation identified. IMPRESSION: No active cardiopulmonary disease. Electronically Signed   By: Signa Kellaylor  Stroud M.D.   On: 01/08/2016 11:39   I have personally reviewed and evaluated these images and lab results as part of my medical decision-making.   EKG Interpretation None      MDM   Final diagnoses:  Asthma, mild intermittent, uncomplicated  Cough   40 y.o. F here with cough, wheezing, SOB x 2 weeks.  No chest pain.  VSS.  Hx of asthma, no medications at home.  Exam with intermixed rhonchi and wheezes throughout, no acute distress.  Will plan for CXR, nebs, prednisone.  Will reassess.  1:20 PM After 2 nebs, lung sounds have cleared.  VS remain stable on RA.  Emily Kane reports Emily Kane feels better, has been resting comfortably in room.  Will d/c home with albuterol inhaler, prednisone taper, tessalon for cough.  FU with PCP.  Work note given for today.  Discussed plan with Emily Kane, he/Emily Kane acknowledged understanding and agreed with plan of care.  Return precautions given for new or worsening symptoms.  Garlon HatchetLisa M Kore Madlock, PA-C 01/08/16 1324  Derwood KaplanAnkit Nanavati, MD 01/08/16 1610

## 2016-01-08 NOTE — ED Notes (Signed)
She states she feels a bit better.  She is less short of breath, and had drifted off to a nap before my assessment.  Fine, audible exp. Wheezes persist; whereas before they had been coarse.

## 2016-02-01 ENCOUNTER — Telehealth: Payer: Self-pay | Admitting: *Deleted

## 2016-02-01 NOTE — Telephone Encounter (Signed)
error 

## 2017-02-04 ENCOUNTER — Emergency Department (HOSPITAL_COMMUNITY)
Admission: EM | Admit: 2017-02-04 | Discharge: 2017-02-04 | Disposition: A | Discharge status: Home / Self Care | Payer: Managed Care, Other (non HMO) | Attending: Emergency Medicine | Admitting: Emergency Medicine

## 2017-02-04 ENCOUNTER — Encounter (HOSPITAL_COMMUNITY): Payer: Self-pay | Admitting: Emergency Medicine

## 2017-02-04 DIAGNOSIS — Z91040 Latex allergy status: Secondary | ICD-10-CM | POA: Insufficient documentation

## 2017-02-04 DIAGNOSIS — S80862A Insect bite (nonvenomous), left lower leg, initial encounter: Secondary | ICD-10-CM | POA: Insufficient documentation

## 2017-02-04 DIAGNOSIS — L03116 Cellulitis of left lower limb: Secondary | ICD-10-CM

## 2017-02-04 DIAGNOSIS — Y93H2 Activity, gardening and landscaping: Secondary | ICD-10-CM | POA: Insufficient documentation

## 2017-02-04 DIAGNOSIS — F17210 Nicotine dependence, cigarettes, uncomplicated: Secondary | ICD-10-CM | POA: Insufficient documentation

## 2017-02-04 DIAGNOSIS — J45909 Unspecified asthma, uncomplicated: Secondary | ICD-10-CM | POA: Insufficient documentation

## 2017-02-04 DIAGNOSIS — Y999 Unspecified external cause status: Secondary | ICD-10-CM | POA: Insufficient documentation

## 2017-02-04 DIAGNOSIS — Z79899 Other long term (current) drug therapy: Secondary | ICD-10-CM | POA: Insufficient documentation

## 2017-02-04 DIAGNOSIS — Y92007 Garden or yard of unspecified non-institutional (private) residence as the place of occurrence of the external cause: Secondary | ICD-10-CM | POA: Insufficient documentation

## 2017-02-04 DIAGNOSIS — W57XXXA Bitten or stung by nonvenomous insect and other nonvenomous arthropods, initial encounter: Secondary | ICD-10-CM

## 2017-02-04 MED ORDER — CEPHALEXIN 500 MG PO CAPS
500.0000 mg | ORAL_CAPSULE | Freq: Once | ORAL | Status: AC
  Administered 2017-02-04: 500 mg via ORAL
  Filled 2017-02-04: qty 1

## 2017-02-04 MED ORDER — CEPHALEXIN 500 MG PO CAPS: 500.0000 mg | ORAL_CAPSULE | Freq: Two times a day (BID) | ORAL | 0 refills | Status: DC

## 2017-02-04 NOTE — ED Notes (Signed)
Unable to complete triage vitals at this time PT currently in restroom.

## 2017-02-04 NOTE — ED Provider Notes (Signed)
[facility]-EMERGENCY DEPT Provider Note   CSN: [id number] Arrival date & time: 02/04/17  0801     History   Chief Complaint Chief Complaint  Patient presents with  . Insect Bite    HPI Emily Kane is a 41 y.o. female.  HPI   Patient is a 41 year old female with history of asthma who presents to the ED with complaint of insect bites to left lower leg. Patient reports she was working out in the yard yesterday and reports noticing 2 small insect bites to her left lower leg with associated itching. She notes throughout the day she began having worsening swelling and redness to the areas with mild associated pain. Patient denies taking any medications at home for her symptoms. Denies fever, chills, chest pain, shortness of breath, facial swelling, abdominal pain, nausea, vomiting, numbness, weakness. Patient denies any new soaps, lotions, detergents or medications. Denies any recent fall, trauma or injury to her legs.  Past Medical History:  Diagnosis Date  . Asthma     There are no active problems to display for this patient.   Past Surgical History:  Procedure Laterality Date  . ROTATOR CUFF REPAIR    . TUBAL LIGATION      OB History    No data available       Home Medications    Prior to Admission medications   Medication Sig Start Date End Date Taking? Authorizing Provider  albuterol (PROVENTIL HFA;VENTOLIN HFA) 108 (90 BASE) MCG/ACT inhaler Inhale 1-2 puffs into the lungs every 6 (six) hours as needed for wheezing or shortness of breath. 06/28/13   Bethann Berkshire, MD  benzonatate (TESSALON) 100 MG capsule Take 1 capsule (100 mg total) by mouth every 8 (eight) hours. 01/08/16   Garlon Hatchet, PA-C  cephALEXin (KEFLEX) 500 MG capsule Take 1 capsule (500 mg total) by mouth 2 (two) times daily. 02/04/17   Barrett Henle, PA-C  naproxen (NAPROSYN) 500 MG tablet Take 1 tablet (500 mg total) by mouth 2 (two) times daily with a meal. Patient not taking: Reported on  06/23/2015 05/14/14   Arthor Captain, PA-C  predniSONE (DELTASONE) 20 MG tablet Take 40 mg by mouth daily for 3 days, then 20mg  by mouth daily for 3 days, then 10mg  daily for 3 days 01/08/16   Garlon Hatchet, PA-C    Family History Family History  Problem Relation Age of Onset  . Cancer Other   . Diabetes Other     Social History Social History  Substance Use Topics  . Smoking status: Current Every Day Smoker    Packs/day: 1.00    Types: Cigarettes  . Smokeless tobacco: Never Used  . Alcohol use No     Comment: former     Allergies   Latex   Review of Systems Review of Systems  Skin: Positive for color change (redness).  All other systems reviewed and are negative.    Physical Exam Updated Vital Signs BP 100/72 (BP Location: Left Arm)   Pulse 84   Temp 98.8 F (37.1 C) (Oral)   Resp 17   Wt 86.2 kg (190 lb)   LMP 01/30/2017   SpO2 100%   BMI 28.06 kg/m   Physical Exam  Constitutional: She is oriented to person, place, and time. She appears well-developed and well-nourished. No distress.  HENT:  Head: Normocephalic and atraumatic.  Mouth/Throat: Oropharynx is clear and moist. No oropharyngeal exudate.  No oral lesions  Eyes: Conjunctivae and EOM are normal. Right eye  exhibits no discharge. Left eye exhibits no discharge. No scleral icterus.  Neck: Normal range of motion. Neck supple.  Cardiovascular: Normal rate, regular rhythm, normal heart sounds and intact distal pulses.   Pulmonary/Chest: Effort normal and breath sounds normal. No respiratory distress. She has no wheezes. She has no rales. She exhibits no tenderness.  Abdominal: Soft. She exhibits no distension. There is no tenderness.  Musculoskeletal: Normal range of motion. She exhibits no edema, tenderness or deformity.  No calf swelling or tenderness  Neurological: She is alert and oriented to person, place, and time.  Skin: Skin is warm and dry. Capillary refill takes less than 2 seconds. She is  not diaphoretic. There is erythema.  15x12 cm and 8x8cm area of erythema, warmth and swelling present to anterior/lateral aspect left lower leg inferior to knee. No pustules, vesicles, bulla or drainage present. No induration or fluctuance. No other rash/lesions noted.   Nursing note and vitals reviewed.    ED Treatments / Results  Labs (all labs ordered are listed, but only abnormal results are displayed) Labs Reviewed - No data to display  EKG  EKG Interpretation None       Radiology No results found.  Procedures Procedures (including critical care time)  Medications Ordered in ED Medications  cephALEXin (KEFLEX) capsule 500 mg (not administered)     Initial Impression / Assessment and Plan / ED Course  I have reviewed the triage vital signs and the nursing notes.  Pertinent labs & imaging results that were available during my care of the patient were reviewed by me and considered in my medical decision making (see chart for details).     Pt is without risk factors for HIV; no recent use of steroids or other immunosuppressive medications; no Hx of diabetes.  Pt is without gross abscess for which I&D would be possible.  Area marked and pt encouraged to return if redness begins to streak, extends beyond the markings, and/or fever or nausea/vomiting develop.  Pt is alert, oriented, NAD, afebrile, non tachycardic, nonseptic and nontoxic appearing.  Pt to be d/c on oral antibiotics, symptomatic tx and strict f/u instructions.     Final Clinical Impressions(s) / ED Diagnoses   Final diagnoses:  Insect bite, initial encounter  Cellulitis of left lower extremity    New Prescriptions New Prescriptions   CEPHALEXIN (KEFLEX) 500 MG CAPSULE    Take 1 capsule (500 mg total) by mouth 2 (two) times daily.     Barrett Henleadeau, Dustine Stickler Elizabeth, PA-C 02/04/17 1206    Derwood KaplanNanavati, Ankit, MD 02/05/17 [phone]

## 2017-02-04 NOTE — ED Notes (Signed)
Pt reports she was mowing the lawn yesterday when she felt something bite her.  Noted redness and swelling at 1100 am, today at 1000 redness is spreading, and swelling is worsening.  Pt reports itching and pain.

## 2017-02-04 NOTE — ED Triage Notes (Signed)
Patient states that she was mowing yard yesterday when something bit her left lower leg once under knee then one on posterior calf. Patient has swelling, erythema, pain and warmth to area.

## 2017-02-04 NOTE — ED Notes (Signed)
Bed: WA05 Expected date:  Expected time:  Means of arrival:  Comments: 

## 2017-02-04 NOTE — Discharge Instructions (Signed)
Take antibiotic as prescribed until completed. You may also take Benadryl as prescribed over-the-counter as needed for itching or apply Calamine lotion over the counter. Return to the emergency department in 2 days for wound recheck if your symptoms have not improved or worsened.

## 2018-04-03 ENCOUNTER — Other Ambulatory Visit: Payer: Self-pay

## 2018-04-03 ENCOUNTER — Encounter (HOSPITAL_COMMUNITY): Payer: Self-pay | Admitting: *Deleted

## 2018-04-03 ENCOUNTER — Emergency Department (HOSPITAL_COMMUNITY)
Admission: EM | Admit: 2018-04-03 | Discharge: 2018-04-03 | Disposition: A | Discharge status: Home / Self Care | Payer: Managed Care, Other (non HMO) | Attending: Emergency Medicine | Admitting: Emergency Medicine

## 2018-04-03 DIAGNOSIS — M545 Low back pain: Secondary | ICD-10-CM | POA: Insufficient documentation

## 2018-04-03 DIAGNOSIS — J45909 Unspecified asthma, uncomplicated: Secondary | ICD-10-CM | POA: Insufficient documentation

## 2018-04-03 DIAGNOSIS — Z760 Encounter for issue of repeat prescription: Secondary | ICD-10-CM | POA: Insufficient documentation

## 2018-04-03 DIAGNOSIS — F17210 Nicotine dependence, cigarettes, uncomplicated: Secondary | ICD-10-CM | POA: Insufficient documentation

## 2018-04-03 MED ORDER — ALBUTEROL SULFATE HFA 108 (90 BASE) MCG/ACT IN AERS: 1.0000 | INHALATION_SPRAY | Freq: Four times a day (QID) | RESPIRATORY_TRACT | 0 refills | Status: DC | PRN

## 2018-04-03 MED ORDER — METHOCARBAMOL 500 MG PO TABS: 500.0000 mg | ORAL_TABLET | Freq: Three times a day (TID) | ORAL | 0 refills | Status: DC | PRN

## 2018-04-03 MED ORDER — NAPROXEN 500 MG PO TABS: 500.0000 mg | ORAL_TABLET | Freq: Two times a day (BID) | ORAL | 0 refills | Status: DC

## 2018-04-03 NOTE — ED Provider Notes (Signed)
[facility]-EMERGENCY DEPT Provider Note   CSN: [id number] Arrival date & time: 04/03/18  2008     History   Chief Complaint Chief Complaint  Patient presents with  . Back Pain    HPI Emily Kane is a 42 y.o. female with history of tobacco abuse, asthma, and tubal ligation who presents to the emergency department with complaints of lower back pain over the past 2 days.  Patient states pain is located in the bilateral lower back, at times it radiates down to the buttock/back of the upper part of the legs.  She describes the pain as a cramping/spasm/tightening.  Pain is mild at rest, worsens with movement and ambulation, 10 out of 10 at worst.  He has not tried intervention prior to arrival.  She reports history of similar back pain which has been related to muscle spasms, she is unsure what medication she has received in the past that have helped with this.  She does report intermittent paresthesias to the hands and feet which are not chronic and unchanged. Denies numbness,  weakness, incontinence to bowel/bladder, fever, chills, IV drug use, or hx of cancer.  Denies chance of pregnancy.   Patient also requesting a refill of her inhaler as she ran out, she is not having any cough, shortness of breath, wheezing, or fevers.  HPI  Past Medical History:  Diagnosis Date  . Asthma     There are no active problems to display for this patient.   Past Surgical History:  Procedure Laterality Date  . ROTATOR CUFF REPAIR    . TUBAL LIGATION       OB History   None      Home Medications    Prior to Admission medications   Medication Sig Start Date End Date Taking? Authorizing Provider  albuterol (PROVENTIL HFA;VENTOLIN HFA) 108 (90 BASE) MCG/ACT inhaler Inhale 1-2 puffs into the lungs every 6 (six) hours as needed for wheezing or shortness of breath. 06/28/13  Yes Bethann BerkshireZammit, Joseph, MD  benzonatate (TESSALON) 100 MG capsule Take 1 capsule (100 mg total) by mouth  every 8 (eight) hours. Patient not taking: Reported on 04/03/2018 01/08/16   Garlon HatchetSanders, Lisa M, PA-C  cephALEXin (KEFLEX) 500 MG capsule Take 1 capsule (500 mg total) by mouth 2 (two) times daily. Patient not taking: Reported on 04/03/2018 02/04/17   Barrett HenleNadeau, Nicole Elizabeth, PA-C  naproxen (NAPROSYN) 500 MG tablet Take 1 tablet (500 mg total) by mouth 2 (two) times daily with a meal. Patient not taking: Reported on 06/23/2015 05/14/14   Arthor CaptainHarris, Abigail, PA-C  predniSONE (DELTASONE) 20 MG tablet Take 40 mg by mouth daily for 3 days, then 20mg  by mouth daily for 3 days, then 10mg  daily for 3 days Patient not taking: Reported on 04/03/2018 01/08/16   Garlon HatchetSanders, Lisa M, PA-C    Family History Family History  Problem Relation Age of Onset  . Cancer Other   . Diabetes Other     Social History Social History   Tobacco Use  . Smoking status: Current Every Day Smoker    Packs/day: 1.00    Types: Cigarettes  . Smokeless tobacco: Never Used  Substance Use Topics  . Alcohol use: No    Comment: former  . Drug use: Yes    Types: Marijuana     Allergies   Latex   Review of Systems Review of Systems  Constitutional: Negative for chills, fever and unexpected weight change.  Respiratory: Negative for cough, shortness of breath  and wheezing.   Cardiovascular: Negative for chest pain.  Genitourinary: Negative for dysuria.  Musculoskeletal: Positive for back pain.  Neurological: Negative for weakness and numbness.       Positive for Chronic unchanged paresthesias that are intermittent to hands and feet.  Negative for incontinence to bowel or bladder.  Negative for saddle anesthesia.   Physical Exam Updated Vital Signs BP 116/83 (BP Location: Left Arm)   Pulse 87   Temp 98.3 F (36.8 C) (Oral)   Resp 19   LMP 03/23/2018   SpO2 99%   Physical Exam  Constitutional: She appears well-developed and well-nourished. No distress.  HENT:  Head: Normocephalic and atraumatic.  Eyes: Conjunctivae are  normal. Right eye exhibits no discharge. Left eye exhibits no discharge.  Cardiovascular: Normal rate and regular rhythm.  Pulmonary/Chest: Effort normal and breath sounds normal. No stridor. No respiratory distress. She has no wheezes. She has no rales.  Musculoskeletal:  No obvious deformity, appreciable swelling, erythema, ecchymosis, or open wounds Back: Patient diffusely tender to the lumbar region including midline and bilateral paraspinal muscle tenderness to palpation.  No point/focal vertebral tenderness. Extremities: Normal range of motion.  Nontender.  Neurological: She is alert.  Clear speech.  Sensation grossly intact bilateral lower extremities.  5 out of 5 strength with plantar dorsiflexion bilaterally.  Patellar DTRs are 2+ and symmetric.  Gait is antalgic but intact.  Skin: Skin is warm and dry.  Psychiatric: She has a normal mood and affect. Her behavior is normal. Thought content normal.  Nursing note and vitals reviewed.  ED Treatments / Results  Labs (all labs ordered are listed, but only abnormal results are displayed) Labs Reviewed - No data to display  EKG None  Radiology No results found.  Procedures Procedures (including critical care time)  Medications Ordered in ED Medications - No data to display   Initial Impression / Assessment and Plan / ED Course  I have reviewed the triage vital signs and the nursing notes.  Pertinent labs & imaging results that were available during my care of the patient were reviewed by me and considered in my medical decision making (see chart for details).    Patient presents with complaint of back pain.  Patient is nontoxic appearing, vitals are WNL. Patient has normal neurologic exam, no point/focal midline tenderness to palpation. She is ambulatory in the ED.  No back pain red flags. No urinary sxs. Most likely muscle strain versus spasm. Considered disc disease, UTI/pyelonephritis, kidney stone, aortic  aneurysm/dissection, cauda equina or epidural abscess however these do not fit clinical picture at this time. Will treat with Naproxen and Robaxin, discussed with patient that they are not to drive or operate heavy machinery while taking Robaxin.  Inhaler refill also provided.  I discussed treatment plan, need for PCP follow-up, and return precautions with the patient. Provided opportunity for questions, patient confirmed understanding and is in agreement with plan.   Final Clinical Impressions(s) / ED Diagnoses   Final diagnoses:  Acute bilateral low back pain, with sciatica presence unspecified  Medication refill    ED Discharge Orders         Ordered    naproxen (NAPROSYN) 500 MG tablet  2 times daily     04/03/18 2116    methocarbamol (ROBAXIN) 500 MG tablet  Every 8 hours PRN     04/03/18 2116    albuterol (PROVENTIL HFA;VENTOLIN HFA) 108 (90 Base) MCG/ACT inhaler  Every 6 hours PRN     04/03/18  2116           Cherly Anderson, PA-C 04/03/18 2153    Samuel Jester, DO 04/03/18 2302

## 2018-04-03 NOTE — Discharge Instructions (Signed)
Back Pain:  I have prescribed you an anti-inflammatory medication and a muscle relaxer.   Naproxen is a nonsteroidal anti-inflammatory medication that will help with pain and swelling. Be sure to take this medication as prescribed with food, 1 pill every 12 hours,  It should be taken with food, as it can cause stomach upset, and more seriously, stomach bleeding. Do not take other nonsteroidal anti-inflammatory medications with this such as Advil, Motrin, or Aleve.   Robaxin is the muscle relaxer I have prescribed, this is meant to help with muscle tightness. Be aware that this medication may make you drowsy therefore the first time you take this it should be at a time you are in an environment where you can rest. Do not drive or operate heavy machinery when taking this medication.   In addition you may also take Tylenol. Tylenol is generally safe, though you should not take more than 8 of the extra strength (500mg ) pills a day.  The application of heat can help soothe the pain.  Maintaining your daily activities, including walking, is encourged, as it will help you get better faster than just staying in bed.  Low back pain is discomfort in the lower back that may be due to injuries to muscles and ligaments around the spine.  Occasionally, it may be caused by a a problem to a part of the spine called a disc.  The pain may last several days or a few weeks.   Your pain should get better over the next 2 weeks.  You will need to follow up with  Your primary healthcare provider in 1-2 weeks for reassessment, if you do not have a primary care provider one is provided in your discharge instructions. However if you develop severe or worsening pain, low back pain with fever, numbness, weakness, loss of bowel or bladder control, or inability to walk or urinate, you should return to the ER immediately.  Please follow up with your doctor this week for a recheck if still having symptoms.   We have also refilled  your inhaler that she would agree with the pharmacy as well.

## 2018-04-03 NOTE — ED Triage Notes (Signed)
Pt arrives with c/o lower back pain that radiates to both sides of her back and to her upper legs. Pain started while she was walking yesterday. No meds taken PTA. Denies bowel/urine incontinence.  Reports tingling in her fingers and toes, but this is not new.

## 2018-11-03 ENCOUNTER — Encounter (HOSPITAL_COMMUNITY): Payer: Self-pay | Admitting: *Deleted

## 2018-11-03 ENCOUNTER — Other Ambulatory Visit: Payer: Self-pay

## 2018-11-03 ENCOUNTER — Emergency Department (HOSPITAL_COMMUNITY)
Admission: EM | Admit: 2018-11-03 | Discharge: 2018-11-03 | Disposition: A | Discharge status: Home / Self Care | Payer: Self-pay | Attending: Emergency Medicine | Admitting: Emergency Medicine

## 2018-11-03 DIAGNOSIS — R69 Illness, unspecified: Secondary | ICD-10-CM

## 2018-11-03 DIAGNOSIS — F17210 Nicotine dependence, cigarettes, uncomplicated: Secondary | ICD-10-CM | POA: Insufficient documentation

## 2018-11-03 DIAGNOSIS — J0110 Acute frontal sinusitis, unspecified: Secondary | ICD-10-CM | POA: Insufficient documentation

## 2018-11-03 DIAGNOSIS — J45909 Unspecified asthma, uncomplicated: Secondary | ICD-10-CM | POA: Insufficient documentation

## 2018-11-03 DIAGNOSIS — Z79899 Other long term (current) drug therapy: Secondary | ICD-10-CM | POA: Insufficient documentation

## 2018-11-03 DIAGNOSIS — J111 Influenza due to unidentified influenza virus with other respiratory manifestations: Secondary | ICD-10-CM | POA: Insufficient documentation

## 2018-11-03 MED ORDER — AMOXICILLIN-POT CLAVULANATE 875-125 MG PO TABS
1.0000 | ORAL_TABLET | Freq: Once | ORAL | Status: AC
  Administered 2018-11-03: 1 via ORAL
  Filled 2018-11-03: qty 1

## 2018-11-03 MED ORDER — AMOXICILLIN-POT CLAVULANATE 875-125 MG PO TABS: 1.0000 | ORAL_TABLET | Freq: Two times a day (BID) | ORAL | 0 refills | Status: DC

## 2018-11-03 MED ORDER — BENZONATATE 100 MG PO CAPS: 100.0000 mg | ORAL_CAPSULE | Freq: Three times a day (TID) | ORAL | 0 refills | Status: DC

## 2018-11-03 MED ORDER — IBUPROFEN 800 MG PO TABS
800.0000 mg | ORAL_TABLET | Freq: Once | ORAL | Status: AC
  Administered 2018-11-03: 800 mg via ORAL
  Filled 2018-11-03: qty 1

## 2018-11-03 MED ORDER — ACETAMINOPHEN 500 MG PO TABS
1000.0000 mg | ORAL_TABLET | Freq: Once | ORAL | Status: AC
  Administered 2018-11-03: 1000 mg via ORAL
  Filled 2018-11-03: qty 2

## 2018-11-03 NOTE — ED Notes (Signed)
Patient verbalizes understanding of discharge instructions . Opportunity for questions and answers were provided . Armband removed by staff ,Pt discharged from ED. W/C  offered at D/C  and Declined W/C at D/C and was escorted to lobby by RN.  

## 2018-11-03 NOTE — ED Triage Notes (Signed)
PT reports HA and frver that started  2 days ago. Pt does not have fever at triage. Pt reports coworker tested pos.  for the Flu.

## 2018-11-03 NOTE — ED Provider Notes (Signed)
[facility] EMERGENCY DEPARTMENT Provider Note   CSN: [id number] Arrival date & time: 11/03/18  1105    History   Chief Complaint Chief Complaint  Patient presents with  . Headache    HPI Emily Kane is a 43 y.o. female.     43 yo F with a cc of headache cough congestion fevers and myalgias.  Going on since yesterday.  She has 2 coworkers that have come down with an illness over both documented to have the flu.  She has not checked her temperature at home but has felt hot and cold.  Has a mild sore throat.  Complaining of a bilateral frontal headache but worse on the left than the right.  Denies neck pain or stiffness.  Denies shortness of breath.  The history is provided by the patient.  Headache  Associated symptoms: congestion, cough, fever and myalgias   Associated symptoms: no dizziness, no nausea and no vomiting   Illness  Severity:  Mild Onset quality:  Gradual Duration:  1 day Timing:  Constant Progression:  Worsening Chronicity:  New Associated symptoms: congestion, cough, fever, headaches and myalgias   Associated symptoms: no chest pain, no nausea, no rhinorrhea, no shortness of breath, no vomiting and no wheezing     Past Medical History:  Diagnosis Date  . Asthma     There are no active problems to display for this patient.   Past Surgical History:  Procedure Laterality Date  . ROTATOR CUFF REPAIR    . TUBAL LIGATION       OB History   No obstetric history on file.      Home Medications    Prior to Admission medications   Medication Sig Start Date End Date Taking? Authorizing Provider  albuterol (PROVENTIL HFA;VENTOLIN HFA) 108 (90 Base) MCG/ACT inhaler Inhale 1-2 puffs into the lungs every 6 (six) hours as needed for wheezing or shortness of breath. 04/03/18   Petrucelli, Samantha R, PA-C  amoxicillin-clavulanate (AUGMENTIN) 875-125 MG tablet Take 1 tablet by mouth 2 (two) times daily. 11/03/18   Melene Plan, DO   benzonatate (TESSALON) 100 MG capsule Take 1 capsule (100 mg total) by mouth every 8 (eight) hours. 11/03/18   Melene Plan, DO  methocarbamol (ROBAXIN) 500 MG tablet Take 1 tablet (500 mg total) by mouth every 8 (eight) hours as needed for muscle spasms. 04/03/18   Petrucelli, Samantha R, PA-C  naproxen (NAPROSYN) 500 MG tablet Take 1 tablet (500 mg total) by mouth 2 (two) times daily. 04/03/18   Petrucelli, Pleas Koch, PA-C    Family History Family History  Problem Relation Age of Onset  . Cancer Other   . Diabetes Other     Social History Social History   Tobacco Use  . Smoking status: Current Every Day Smoker    Packs/day: 1.00    Types: Cigarettes  . Smokeless tobacco: Never Used  Substance Use Topics  . Alcohol use: No    Comment: former  . Drug use: Yes    Types: Marijuana     Allergies   Latex   Review of Systems Review of Systems  Constitutional: Positive for chills and fever.  HENT: Positive for congestion. Negative for rhinorrhea.   Eyes: Negative for redness and visual disturbance.  Respiratory: Positive for cough. Negative for shortness of breath and wheezing.   Cardiovascular: Negative for chest pain and palpitations.  Gastrointestinal: Negative for nausea and vomiting.  Genitourinary: Negative for dysuria and urgency.  Musculoskeletal: Positive  for myalgias. Negative for arthralgias.  Skin: Negative for pallor and wound.  Neurological: Positive for headaches. Negative for dizziness.     Physical Exam Updated Vital Signs BP 113/75 (BP Location: Right Arm)   Pulse 92   Temp 98.2 F (36.8 C) (Oral)   Resp 20   Ht 5\' 9"  (1.753 m)   Wt 76.2 kg   LMP 11/03/2018   SpO2 100%   BMI 24.81 kg/m   Physical Exam Vitals signs and nursing note reviewed.  Constitutional:      General: She is not in acute distress.    Appearance: She is well-developed. She is not diaphoretic.  HENT:     Head: Normocephalic and atraumatic.     Comments: Swollen turbinates,  posterior nasal drip,exquisitely tender to the L frontal region, tm normal bilaterally.   Eyes:     Pupils: Pupils are equal, round, and reactive to light.  Neck:     Musculoskeletal: Normal range of motion and neck supple.  Cardiovascular:     Rate and Rhythm: Normal rate and regular rhythm.     Heart sounds: No murmur. No friction rub. No gallop.   Pulmonary:     Effort: Pulmonary effort is normal.     Breath sounds: No wheezing or rales.  Abdominal:     General: There is no distension.     Palpations: Abdomen is soft.     Tenderness: There is no abdominal tenderness.  Musculoskeletal:        General: No tenderness.  Skin:    General: Skin is warm and dry.  Neurological:     Mental Status: She is alert and oriented to person, place, and time.  Psychiatric:        Behavior: Behavior normal.      ED Treatments / Results  Labs (all labs ordered are listed, but only abnormal results are displayed) Labs Reviewed - No data to display  EKG None  Radiology No results found.  Procedures Procedures (including critical care time)  Medications Ordered in ED Medications  acetaminophen (TYLENOL) tablet 1,000 mg (has no administration in time range)  ibuprofen (ADVIL,MOTRIN) tablet 800 mg (has no administration in time range)  amoxicillin-clavulanate (AUGMENTIN) 875-125 MG per tablet 1 tablet (has no administration in time range)     Initial Impression / Assessment and Plan / ED Course  I have reviewed the triage vital signs and the nursing notes.  Pertinent labs & imaging results that were available during my care of the patient were reviewed by me and considered in my medical decision making (see chart for details).        43 yo F with a chief complaint of cough congestion fever chills myalgias started yesterday.  Also having a headache.  She has significant sinus tenderness percussion to the left frontal sinus.  Will treat as possible sinusitis.  She has 2 coworkers  that had the flu.  She has no lower respiratory symptoms.  She does have sinus congestion.  It makes her significantly less likely to have the novel coronavirus.  She has no fever here.  Will write around for 48 hours from work.  Augmentin for sinusitis.  Emily Kane was evaluated in Emergency Department on 11/03/2018 for the symptoms described in the history of present illness. He/she was evaluated in the context of the global COVID-19 pandemic, which necessitated consideration that the patient might be at risk for infection with the SARS-CoV-2 virus that causes COVID-19. Institutional protocols and algorithms  that pertain to the evaluation of patients at risk for COVID-19 are in a state of rapid change based on information released by regulatory bodies including the CDC and federal and state organizations. These policies and algorithms were followed during the patient's care in the ED.  11:40 AM:  I have discussed the diagnosis/risks/treatment options with the patient and believe the pt to be eligible for discharge home to follow-up with PCP. We also discussed returning to the ED immediately if new or worsening sx occur. We discussed the sx which are most concerning (e.g., sudden worsening pain, fever, inability to tolerate by mouth) that necessitate immediate return. Medications administered to the patient during their visit and any new prescriptions provided to the patient are listed below.  Medications given during this visit Medications  acetaminophen (TYLENOL) tablet 1,000 mg (has no administration in time range)  ibuprofen (ADVIL,MOTRIN) tablet 800 mg (has no administration in time range)  amoxicillin-clavulanate (AUGMENTIN) 875-125 MG per tablet 1 tablet (has no administration in time range)     The patient appears reasonably screen and/or stabilized for discharge and I doubt any other medical condition or other [facility] requiring further screening, evaluation, or treatment in the ED at this time  prior to discharge.    Final Clinical Impressions(s) / ED Diagnoses   Final diagnoses:  Acute frontal sinusitis, recurrence not specified  Influenza-like illness    ED Discharge Orders         Ordered    amoxicillin-clavulanate (AUGMENTIN) 875-125 MG tablet  2 times daily     11/03/18 1135    benzonatate (TESSALON) 100 MG capsule  Every 8 hours     11/03/18 1135           Melene Plan, DO 11/03/18 1140

## 2018-11-03 NOTE — Discharge Instructions (Signed)
Take tylenol 2 pills 4 times a day and motrin 4 pills 3 times a day.  Drink plenty of fluids.  Return for worsening shortness of breath, headache, confusion. Follow up with your family doctor.   

## 2019-07-06 ENCOUNTER — Other Ambulatory Visit: Payer: Self-pay

## 2019-07-06 DIAGNOSIS — Z20822 Contact with and (suspected) exposure to covid-19: Secondary | ICD-10-CM

## 2019-07-08 LAB — NOVEL CORONAVIRUS, NAA: SARS-CoV-2, NAA: NOT DETECTED

## 2019-07-10 ENCOUNTER — Telehealth: Payer: Self-pay | Admitting: General Practice

## 2019-07-10 NOTE — Telephone Encounter (Signed)
Negative COVID results given. Patient results "NOT Detected." Caller expressed understanding. ° °

## 2019-11-09 ENCOUNTER — Encounter: Payer: Self-pay | Admitting: Nurse Practitioner

## 2019-11-09 ENCOUNTER — Other Ambulatory Visit (HOSPITAL_COMMUNITY)
Admission: RE | Admit: 2019-11-09 | Discharge: 2019-11-09 | Disposition: A | Discharge status: Home / Self Care | Payer: Managed Care, Other (non HMO) | Source: Ambulatory Visit | Attending: Nurse Practitioner | Admitting: Nurse Practitioner

## 2019-11-09 ENCOUNTER — Other Ambulatory Visit: Payer: Self-pay

## 2019-11-09 ENCOUNTER — Ambulatory Visit: Payer: Managed Care, Other (non HMO) | Admitting: Nurse Practitioner

## 2019-11-09 VITALS — BP 114/80 | HR 88 | Temp 98.6°F | Ht 69.8 in | Wt 179.0 lb

## 2019-11-09 DIAGNOSIS — N760 Acute vaginitis: Secondary | ICD-10-CM | POA: Insufficient documentation

## 2019-11-09 DIAGNOSIS — B9689 Other specified bacterial agents as the cause of diseases classified elsewhere: Secondary | ICD-10-CM | POA: Insufficient documentation

## 2019-11-09 DIAGNOSIS — Z13228 Encounter for screening for other metabolic disorders: Secondary | ICD-10-CM

## 2019-11-09 DIAGNOSIS — Z113 Encounter for screening for infections with a predominantly sexual mode of transmission: Secondary | ICD-10-CM | POA: Insufficient documentation

## 2019-11-09 DIAGNOSIS — J4540 Moderate persistent asthma, uncomplicated: Secondary | ICD-10-CM | POA: Diagnosis not present

## 2019-11-09 DIAGNOSIS — R232 Flushing: Secondary | ICD-10-CM

## 2019-11-09 DIAGNOSIS — Z720 Tobacco use: Secondary | ICD-10-CM

## 2019-11-09 DIAGNOSIS — R21 Rash and other nonspecific skin eruption: Secondary | ICD-10-CM | POA: Diagnosis not present

## 2019-11-09 MED ORDER — FLUCONAZOLE 100 MG PO TABS: 100.0000 mg | ORAL_TABLET | Freq: Every day | ORAL | 0 refills | Status: AC

## 2019-11-09 MED ORDER — NICOTINE 21 MG/24HR TD PT24: 21.0000 mg | MEDICATED_PATCH | Freq: Every day | TRANSDERMAL | 0 refills | Status: AC

## 2019-11-09 MED ORDER — PREDNISONE 10 MG (21) PO TBPK: ORAL_TABLET | ORAL | 0 refills | Status: DC

## 2019-11-09 MED ORDER — ALBUTEROL SULFATE HFA 108 (90 BASE) MCG/ACT IN AERS: 1.0000 | INHALATION_SPRAY | Freq: Four times a day (QID) | RESPIRATORY_TRACT | 2 refills | Status: AC | PRN

## 2019-11-09 MED ORDER — NYSTATIN 100000 UNIT/GM EX CREA: 1.0000 "application " | TOPICAL_CREAM | Freq: Two times a day (BID) | CUTANEOUS | 0 refills | Status: DC

## 2019-11-09 NOTE — Progress Notes (Signed)
This visit occurred during the SARS-CoV-2 public health emergency.  Safety protocols were in place, including screening questions prior to the visit, additional usage of staff PPE, and extensive cleaning of exam room while observing appropriate contact time as indicated for disinfecting solutions.  Subjective:     Patient ID: Emily Kane , female    DOB: 05-Jul-1976 , 44 y.o.   MRN: [id number]   Chief Complaint  Patient presents with  . Establish Care  . Asthma    patient stated she has been having some difficulty breathing and the albuterol does not work for her   . bikini line itching    patient stated the itching starting back when she first met her husband and has not gone away    HPI  Here to establish care - she has not seen a PCP in more than 5 years or GYN.  She was referred here by her mother Bosie Clos.  She is working in a Proofreader.  Married.  She has 4 children - healthy.  Tubal ligation. She is having 2 periods per month and clotting.  LMP - October 25, 2019  PMH - asthma (child) - she has used inhalers in the past (albuterol).    [facility] - maternal grandmother - breast cancer, paternal grandfather/maternal grandfather - lung cancer.  Mother - healthy.  Father - heart surgery, asthma.  2 brothers and sister - asthma.  1 brother (47 years old) - deceased from [place].   Cigarette smoker - 15 cigarettes per day for 24 years. Has not tried any medications to help quit smoking but is interested.    Drinks 5 glasses of liquor per week. Marijuana currently, over 12 years ago had cocaine.     Bikini line itching she does shave with a straight.  She uses a new razor each time. She had to stop shaving because of the itching.  Started 6 years ago when she met her husband.   Asthma She complains of chest tightness (only when having to blow out to get air in) and difficulty breathing (she has to blow out to get air in). There is no cough, shortness of breath or wheezing. This is a  recurrent problem. The current episode started more than 1 month ago (the last 6 months has worsened). The problem occurs daily. The problem has been unchanged. Associated symptoms include dyspnea on exertion. Pertinent negatives include no appetite change, chest pain or sneezing. Exacerbated by: if she gets too excited. Her symptoms are alleviated by rest. Her symptoms are not alleviated by ipratropium. Risk factors for lung disease include smoking/tobacco exposure. Her past medical history is significant for asthma.     Past Medical History:  Diagnosis Date  . Asthma      Family History  Problem Relation Age of Onset  . Cancer Other   . Diabetes Other   . Healthy Mother   . Asthma Father   . Heart disease Father   . Asthma Sister   . Healthy Brother   . Healthy Brother   . Cancer Maternal Grandmother   . Cancer Maternal Grandfather   . Heart attack Paternal Grandmother   . Emphysema Paternal Grandfather   . Cancer Paternal Grandfather      Current Outpatient Medications:  .  albuterol (PROVENTIL HFA;VENTOLIN HFA) 108 (90 Base) MCG/ACT inhaler, Inhale 1-2 puffs into the lungs every 6 (six) hours as needed for wheezing or shortness of breath., Disp: 1 Inhaler, Rfl: 0   Allergies  Allergen Reactions  . Latex Swelling     Review of Systems  Constitutional: Negative for appetite change.  HENT: Negative for sneezing.   Respiratory: Negative for cough, shortness of breath and wheezing.   Cardiovascular: Positive for dyspnea on exertion. Negative for chest pain.  Skin: Positive for rash (rash to creases of her vagina and itching).     Today's Vitals   11/09/19 0842  BP: 114/80  Pulse: 88  Temp: 98.6 F (37 C)  TempSrc: Oral  SpO2: 97%  Weight: 179 lb (81.2 kg)  Height: 5' 9.8" (1.773 m)  PainSc: 0-No pain   Body mass index is 25.83 kg/m.   Objective:  Physical Exam Constitutional:      General: She is not in acute distress.    Appearance: Normal appearance.   Cardiovascular:     Rate and Rhythm: Normal rate and regular rhythm.  Pulmonary:     Effort: Pulmonary effort is normal.     Breath sounds: Normal breath sounds.  Skin:    General: Skin is warm.     Findings: Rash (hyperpigmented rash to crease of vagina) present.  Neurological:     General: No focal deficit present.     Mental Status: She is alert and oriented to person, place, and time.  Psychiatric:        Mood and Affect: Mood normal.        Behavior: Behavior normal.        Thought Content: Thought content normal.        Judgment: Judgment normal.         Assessment And Plan:     1. Moderate persistent asthma, unspecified whether complicated  Breathing is worsening, no wheezing noted however she reports intermittent dyspnea and wheezing. Has not had any medications in quite some time.   Will treat with prednisone and provide an albuterol inhaler, she is advised if she has to use her inhaler more than 2 times a week after the steroids to call to office - predniSONE (STERAPRED UNI-PAK 21 TAB) 10 MG (21) TBPK tablet; Take as directed  Dispense: 21 tablet; Refill: 0 - albuterol (VENTOLIN HFA) 108 (90 Base) MCG/ACT inhaler; Inhale 1-2 puffs into the lungs every 6 (six) hours as needed for wheezing or shortness of breath.  Dispense: 18 g; Refill: 2  2. Rash and nonspecific skin eruption  Hyperpigmented inflamed skin to creases of vagina - nystatin cream (MYCOSTATIN); Apply 1 application topically 2 (two) times daily.  Dispense: 30 g; Refill: 0 - fluconazole (DIFLUCAN) 100 MG tablet; Take 1 tablet (100 mg total) by mouth daily. Take 1 tablet by mouth now repeat in 5 days  Dispense: 2 tablet; Refill: 0  3. Encounter for screening for metabolic disorder  - CBC - Lipid panel - CMP14+EGFR  4. Tobacco abuse  Smoking cessation instruction/counseling given:  counseled patient on the dangers of tobacco use, advised patient to stop smoking, and reviewed strategies to maximize  success   - nicotine (NICODERM CQ - DOSED IN MG/24 HOURS) 21 mg/24hr patch; Place 1 patch (21 mg total) onto the skin daily.  Dispense: 28 patch; Refill: 0  5. Hot flashes  Will check thyroid and hormone levels - FSH/LH - TSH  6. Screening examination for STD (sexually transmitted disease)  - Cervicovaginal ancillary only - HIV antibody (with reflex) - T pallidum Screening [place]   Minette Brine, FNP    THE PATIENT IS ENCOURAGED TO PRACTICE SOCIAL DISTANCING DUE TO THE COVID-19 PANDEMIC.

## 2019-11-10 LAB — CMP14+EGFR
ALT: 11 IU/L (ref 0–32)
AST: 17 IU/L (ref 0–40)
Albumin/Globulin Ratio: 1.7 (ref 1.2–2.2)
Albumin: 4.3 g/dL (ref 3.8–4.8)
Alkaline Phosphatase: 47 IU/L (ref 39–117)
BUN/Creatinine Ratio: 9 (ref 9–23)
BUN: 9 mg/dL (ref 6–24)
Bilirubin Total: 0.3 mg/dL (ref 0.0–1.2)
CO2: 22 mmol/L (ref 20–29)
Calcium: 9.7 mg/dL (ref 8.7–10.2)
Chloride: 103 mmol/L (ref 96–106)
Creatinine, Ser: 1.03 mg/dL — ABNORMAL HIGH (ref 0.57–1.00)
GFR calc Af Amer: 77 mL/min/{1.73_m2} (ref >59)
GFR calc non Af Amer: 67 mL/min/{1.73_m2} (ref >59)
Globulin, Total: 2.6 g/dL (ref 1.5–4.5)
Glucose: 89 mg/dL (ref 65–99)
Potassium: 4.9 mmol/L (ref 3.5–5.2)
Sodium: 137 mmol/L (ref 134–144)
Total Protein: 6.9 g/dL (ref 6.0–8.5)

## 2019-11-10 LAB — TSH: TSH: 0.406 u[IU]/mL — ABNORMAL LOW (ref 0.450–4.500)

## 2019-11-10 LAB — LIPID PANEL
Chol/HDL Ratio: 2.5 ratio (ref 0.0–4.4)
Cholesterol, Total: 168 mg/dL (ref 100–199)
HDL: 67 mg/dL (ref >39)
LDL Chol Calc (NIH): 90 mg/dL (ref 0–99)
Triglycerides: 56 mg/dL (ref 0–149)
VLDL Cholesterol Cal: 11 mg/dL (ref 5–40)

## 2019-11-10 LAB — CERVICOVAGINAL ANCILLARY ONLY
Bacterial Vaginitis (gardnerella): POSITIVE — AB
Candida Glabrata: NEGATIVE
Candida Vaginitis: NEGATIVE
Chlamydia: NEGATIVE
Comment: NEGATIVE
Comment: NEGATIVE
Comment: NEGATIVE
Comment: NEGATIVE
Comment: NEGATIVE
Comment: NORMAL
Neisseria Gonorrhea: NEGATIVE
Trichomonas: NEGATIVE

## 2019-11-10 LAB — FSH/LH
FSH: 8.6 m[IU]/mL
LH: 14.3 m[IU]/mL

## 2019-11-10 LAB — CBC
Hematocrit: 38.9 % (ref 34.0–46.6)
Hemoglobin: 13.8 g/dL (ref 11.1–15.9)
MCH: 32.9 pg (ref 26.6–33.0)
MCHC: 35.5 g/dL (ref 31.5–35.7)
MCV: 93 fL (ref 79–97)
Platelets: 248 10*3/uL (ref 150–450)
RBC: 4.19 x10E6/uL (ref 3.77–5.28)
RDW: 13.2 % (ref 11.7–15.4)
WBC: 4.5 10*3/uL (ref 3.4–10.8)

## 2019-11-10 LAB — T PALLIDUM SCREENING CASCADE: T pallidum Antibodies (TP-PA): NONREACTIVE

## 2019-11-10 LAB — HIV ANTIBODY (ROUTINE TESTING W REFLEX): HIV Screen 4th Generation wRfx: NONREACTIVE

## 2019-11-10 MED ORDER — METRONIDAZOLE 500 MG PO TABS: 500.0000 mg | ORAL_TABLET | Freq: Three times a day (TID) | ORAL | 0 refills | Status: AC

## 2019-12-21 ENCOUNTER — Ambulatory Visit: Payer: Managed Care, Other (non HMO) | Admitting: Nurse Practitioner

## 2019-12-21 NOTE — Progress Notes (Deleted)
Established Patient Office Visit  Subjective:  Patient ID: Emily Kane, female    DOB: Apr 13, 1976  Age: 44 y.o. MRN: [id number]  CC: No chief complaint on file. Testing  HPI Emily Kane presents for  Abdominal Pain: Patient complains of abdominal pain. The pain is located {location abdomen:5010}. The pain is described as {quality:618}, and is {0-10:33138}/10 in intensity. Onset was {0-10:33138} {unit:11} ago. Symptoms have been {course:17::"unchanged"} since. Aggravating factors include {aggrav factors:619}.  Alleviating factors include {allev factors:620}. Associated symptoms include {assoc symptoms:621}. The patient denies {assoc symptoms:621}.     Past Medical History:  Diagnosis Date  . Asthma     Past Surgical History:  Procedure Laterality Date  . ROTATOR CUFF REPAIR    . TUBAL LIGATION      Family History  Problem Relation Age of Onset  . Cancer Other   . Diabetes Other   . Healthy Mother   . Asthma Father   . Heart disease Father   . Asthma Sister   . Healthy Brother   . Healthy Brother   . Cancer Maternal Grandmother   . Cancer Maternal Grandfather   . Heart attack Paternal Grandmother   . Emphysema Paternal Grandfather   . Cancer Paternal Grandfather     Social History   Socioeconomic History  . Marital status: Married    Spouse name: Not on file  . Number of children: Not on file  . Years of education: Not on file  . Highest education level: Not on file  Occupational History  . Not on file  Tobacco Use  . Smoking status: Current Every Day Smoker    Packs/day: 1.00    Types: Cigarettes  . Smokeless tobacco: Never Used  Substance and Sexual Activity  . Alcohol use: Yes    Alcohol/week: 5.0 standard drinks    Types: 5 Standard drinks or equivalent per week  . Drug use: Yes    Types: Marijuana  . Sexual activity: Not on file  Other Topics Concern  . Not on file  Social History Narrative  . Not on file   Social Determinants of  Health   Financial Resource Strain:   . Difficulty of Paying Living Expenses:   Food Insecurity:   . Worried About Charity fundraiser in the Last Year:   . Arboriculturist in the Last Year:   Transportation Needs:   . Film/video editor (Medical):   Marland Kitchen Lack of Transportation (Non-Medical):   Physical Activity:   . Days of Exercise per Week:   . Minutes of Exercise per Session:   Stress:   . Feeling of Stress :   Social Connections:   . Frequency of Communication with Friends and Family:   . Frequency of Social Gatherings with Friends and Family:   . Attends Religious Services:   . Active Member of Clubs or Organizations:   . Attends Archivist Meetings:   Marland Kitchen Marital Status:   Intimate Partner Violence:   . Fear of Current or Ex-Partner:   . Emotionally Abused:   Marland Kitchen Physically Abused:   . Sexually Abused:     Outpatient Medications Prior to Visit  Medication Sig Dispense Refill  . albuterol (VENTOLIN HFA) 108 (90 Base) MCG/ACT inhaler Inhale 1-2 puffs into the lungs every 6 (six) hours as needed for wheezing or shortness of breath. 18 g 2  . fluconazole (DIFLUCAN) 100 MG tablet Take 1 tablet (100 mg total) by mouth daily. Take 1  tablet by mouth now repeat in 5 days 2 tablet 0  . nicotine (NICODERM CQ - DOSED IN MG/24 HOURS) 21 mg/24hr patch Place 1 patch (21 mg total) onto the skin daily. 28 patch 0  . nystatin cream (MYCOSTATIN) Apply 1 application topically 2 (two) times daily. 30 g 0  . predniSONE (STERAPRED UNI-PAK 21 TAB) 10 MG (21) TBPK tablet Take as directed 21 tablet 0   No facility-administered medications prior to visit.    Allergies  Allergen Reactions  . Latex Swelling    ROS Review of Systems    Objective:    Physical Exam  There were no vitals taken for this visit. Wt Readings from Last 3 Encounters:  11/09/19 179 lb (81.2 kg)  11/03/18 168 lb (76.2 kg)  02/04/17 190 lb (86.2 kg)     Health Maintenance Due  Topic Date Due  .  COVID-19 Vaccine (1) Never done  . TETANUS/TDAP  Never done  . PAP SMEAR-Modifier  Never done    There are no preventive care reminders to display for this patient.  Lab Results  Component Value Date   TSH 0.406 (L) 11/09/2019   Lab Results  Component Value Date   WBC 4.5 11/09/2019   HGB 13.8 11/09/2019   HCT 38.9 11/09/2019   MCV 93 11/09/2019   PLT 248 11/09/2019   Lab Results  Component Value Date   NA 137 11/09/2019   K 4.9 11/09/2019   CO2 22 11/09/2019   GLUCOSE 89 11/09/2019   BUN 9 11/09/2019   CREATININE 1.03 (H) 11/09/2019   BILITOT 0.3 11/09/2019   ALKPHOS 47 11/09/2019   AST 17 11/09/2019   ALT 11 11/09/2019   PROT 6.9 11/09/2019   ALBUMIN 4.3 11/09/2019   CALCIUM 9.7 11/09/2019   Lab Results  Component Value Date   CHOL 168 11/09/2019   Lab Results  Component Value Date   HDL 67 11/09/2019   Lab Results  Component Value Date   LDLCALC 90 11/09/2019   Lab Results  Component Value Date   TRIG 56 11/09/2019   Lab Results  Component Value Date   CHOLHDL 2.5 11/09/2019   No results found for: HGBA1C    Assessment & Plan:   Problem List Items Addressed This Visit    None      No orders of the defined types were placed in this encounter.   Follow-up: No follow-ups on file.    Arnette Felts, FNP

## 2020-04-13 ENCOUNTER — Emergency Department (HOSPITAL_COMMUNITY)
Admission: EM | Admit: 2020-04-13 | Discharge: 2020-04-14 | Disposition: A | Discharge status: Home / Self Care | Payer: Managed Care, Other (non HMO) | Attending: Emergency Medicine | Admitting: Emergency Medicine

## 2020-04-13 ENCOUNTER — Emergency Department (HOSPITAL_COMMUNITY): Payer: Managed Care, Other (non HMO)

## 2020-04-13 ENCOUNTER — Encounter (HOSPITAL_COMMUNITY): Payer: Self-pay | Admitting: *Deleted

## 2020-04-13 ENCOUNTER — Other Ambulatory Visit: Payer: Self-pay

## 2020-04-13 DIAGNOSIS — J45901 Unspecified asthma with (acute) exacerbation: Secondary | ICD-10-CM | POA: Diagnosis present

## 2020-04-13 DIAGNOSIS — Z7951 Long term (current) use of inhaled steroids: Secondary | ICD-10-CM | POA: Insufficient documentation

## 2020-04-13 DIAGNOSIS — F17210 Nicotine dependence, cigarettes, uncomplicated: Secondary | ICD-10-CM | POA: Diagnosis not present

## 2020-04-13 DIAGNOSIS — R21 Rash and other nonspecific skin eruption: Secondary | ICD-10-CM

## 2020-04-13 LAB — CBC
HCT: 36.2 % (ref 36.0–46.0)
Hemoglobin: 12 g/dL (ref 12.0–15.0)
MCH: 31.1 pg (ref 26.0–34.0)
MCHC: 33.1 g/dL (ref 30.0–36.0)
MCV: 93.8 fL (ref 80.0–100.0)
Platelets: 227 10*3/uL (ref 150–400)
RBC: 3.86 MIL/uL — ABNORMAL LOW (ref 3.87–5.11)
RDW: 13.8 % (ref 11.5–15.5)
WBC: 6.7 10*3/uL (ref 4.0–10.5)
nRBC: 0 % (ref 0.0–0.2)

## 2020-04-13 MED ORDER — ALBUTEROL SULFATE (2.5 MG/3ML) 0.083% IN NEBU: 5.0000 mg | INHALATION_SOLUTION | Freq: Once | RESPIRATORY_TRACT | Status: DC

## 2020-04-13 MED ORDER — ALBUTEROL SULFATE HFA 108 (90 BASE) MCG/ACT IN AERS
4.0000 | INHALATION_SPRAY | Freq: Once | RESPIRATORY_TRACT | Status: AC
  Administered 2020-04-13: 4 via RESPIRATORY_TRACT
  Filled 2020-04-13: qty 6.7

## 2020-04-13 NOTE — ED Triage Notes (Signed)
The pt is c/o a cold and cough  She has asthma and her inhaler I empty headache chills  No temp for 2-3 days

## 2020-04-14 LAB — COMPREHENSIVE METABOLIC PANEL
ALT: 17 U/L (ref 0–44)
AST: 19 U/L (ref 15–41)
Albumin: 3.6 g/dL (ref 3.5–5.0)
Alkaline Phosphatase: 45 U/L (ref 38–126)
Anion gap: 9 (ref 5–15)
BUN: 11 mg/dL (ref 6–20)
CO2: 22 mmol/L (ref 22–32)
Calcium: 8.9 mg/dL (ref 8.9–10.3)
Chloride: 103 mmol/L (ref 98–111)
Creatinine, Ser: 0.8 mg/dL (ref 0.44–1.00)
GFR calc Af Amer: >60 mL/min (ref >60)
GFR calc non Af Amer: >60 mL/min (ref >60)
Glucose, Bld: 82 mg/dL (ref 70–99)
Potassium: 3.5 mmol/L (ref 3.5–5.1)
Sodium: 134 mmol/L — ABNORMAL LOW (ref 135–145)
Total Bilirubin: 0.5 mg/dL (ref 0.3–1.2)
Total Protein: 6.6 g/dL (ref 6.5–8.1)

## 2020-04-14 LAB — URINALYSIS, ROUTINE W REFLEX MICROSCOPIC
Bilirubin Urine: NEGATIVE
Glucose, UA: NEGATIVE mg/dL
Hgb urine dipstick: NEGATIVE
Ketones, ur: NEGATIVE mg/dL
Leukocytes,Ua: NEGATIVE
Nitrite: NEGATIVE
Protein, ur: NEGATIVE mg/dL
Specific Gravity, Urine: 1.027 (ref 1.005–1.030)
pH: 6 (ref 5.0–8.0)

## 2020-04-14 LAB — I-STAT BETA HCG BLOOD, ED (MC, WL, AP ONLY): I-stat hCG, quantitative: <5 m[IU]/mL (ref <5)

## 2020-04-14 MED ORDER — NYSTATIN 100000 UNIT/GM EX CREA: 1.0000 "application " | TOPICAL_CREAM | Freq: Two times a day (BID) | CUTANEOUS | 0 refills | Status: AC

## 2020-04-14 MED ORDER — PREDNISONE 20 MG PO TABS
60.0000 mg | ORAL_TABLET | Freq: Once | ORAL | Status: AC
  Administered 2020-04-14: 60 mg via ORAL
  Filled 2020-04-14: qty 3

## 2020-04-14 MED ORDER — ALBUTEROL SULFATE HFA 108 (90 BASE) MCG/ACT IN AERS: 1.0000 | INHALATION_SPRAY | RESPIRATORY_TRACT | 2 refills | Status: AC | PRN

## 2020-04-14 MED ORDER — PREDNISONE 20 MG PO TABS: 40.0000 mg | ORAL_TABLET | Freq: Every day | ORAL | 0 refills | Status: DC

## 2020-04-14 MED ORDER — IBUPROFEN 400 MG PO TABS
600.0000 mg | ORAL_TABLET | Freq: Once | ORAL | Status: AC
  Administered 2020-04-14: 600 mg via ORAL
  Filled 2020-04-14: qty 1

## 2020-04-14 MED ORDER — AZITHROMYCIN 250 MG PO TABS: 250.0000 mg | ORAL_TABLET | Freq: Every day | ORAL | 0 refills | Status: AC

## 2020-04-14 MED ORDER — AZITHROMYCIN 250 MG PO TABS: 250.0000 mg | ORAL_TABLET | Freq: Every day | ORAL | 0 refills | Status: DC

## 2020-04-14 MED ORDER — ALBUTEROL SULFATE HFA 108 (90 BASE) MCG/ACT IN AERS: 1.0000 | INHALATION_SPRAY | RESPIRATORY_TRACT | 2 refills | Status: DC | PRN

## 2020-04-14 MED ORDER — NYSTATIN 100000 UNIT/GM EX CREA: TOPICAL_CREAM | CUTANEOUS | 0 refills | Status: AC

## 2020-04-14 MED ORDER — GUAIFENESIN-CODEINE 100-10 MG/5ML PO SYRP: 5.0000 mL | ORAL_SOLUTION | Freq: Three times a day (TID) | ORAL | 0 refills | Status: AC | PRN

## 2020-04-14 NOTE — ED Notes (Signed)
Pt verbalized understanding of discharge instructions. Follow up care and prescriptions reviewed, pt had no further questions. Ambulated to lobby.

## 2020-04-15 ENCOUNTER — Other Ambulatory Visit: Payer: Self-pay | Admitting: Nurse Practitioner

## 2020-04-15 ENCOUNTER — Telehealth: Payer: Self-pay

## 2020-04-15 DIAGNOSIS — J4540 Moderate persistent asthma, uncomplicated: Secondary | ICD-10-CM

## 2020-04-15 DIAGNOSIS — R21 Rash and other nonspecific skin eruption: Secondary | ICD-10-CM

## 2020-04-15 NOTE — Telephone Encounter (Signed)
Called pt to schedule ER follow up, pt stated she works until 6:00 all week she will call the office back when she can arrange a day off at her job

## 2020-04-16 NOTE — ED Provider Notes (Signed)
[facility] EMERGENCY DEPARTMENT Provider Note   CSN: [id number] Arrival date & time: 04/13/20  2244     History Chief Complaint  Patient presents with  . Asthma    Emily Kane is a 44 y.o. female.  HPI   44 year old female with cough and dyspnea.  Onset about 3 days ago.  History of asthma.  She feels like she is having exacerbation.  Out of her inhaler.  Mild headache.  No fevers or chills.  No unusual leg pain or swelling.  No sick contacts that she is aware of.  No acute GI complaints.  Past Medical History:  Diagnosis Date  . Asthma     There are no problems to display for this patient.   Past Surgical History:  Procedure Laterality Date  . ROTATOR CUFF REPAIR    . TUBAL LIGATION       OB History   No obstetric history on file.     Family History  Problem Relation Age of Onset  . Cancer Other   . Diabetes Other   . Healthy Mother   . Asthma Father   . Heart disease Father   . Asthma Sister   . Healthy Brother   . Healthy Brother   . Cancer Maternal Grandmother   . Cancer Maternal Grandfather   . Heart attack Paternal Grandmother   . Emphysema Paternal Grandfather   . Cancer Paternal Grandfather     Social History   Tobacco Use  . Smoking status: Current Every Day Smoker    Packs/day: 1.00    Types: Cigarettes  . Smokeless tobacco: Never Used  Vaping Use  . Vaping Use: Never used  Substance Use Topics  . Alcohol use: Yes    Alcohol/week: 5.0 standard drinks    Types: 5 Standard drinks or equivalent per week  . Drug use: Yes    Types: Marijuana    Home Medications Prior to Admission medications   Medication Sig Start Date End Date Taking? Authorizing Provider  albuterol (VENTOLIN HFA) 108 (90 Base) MCG/ACT inhaler Inhale 1-2 puffs into the lungs every 6 (six) hours as needed for wheezing or shortness of breath. 11/09/19  Yes Arnette Felts, FNP  albuterol (VENTOLIN HFA) 108 (90 Base) MCG/ACT inhaler Inhale 1-2 puffs  into the lungs every 4 (four) hours as needed for wheezing or shortness of breath. 04/14/20   Raeford Razor, MD  azithromycin (ZITHROMAX Z-PAK) 250 MG tablet Take 1 tablet (250 mg total) by mouth daily. 2 pills (500mg ) day 1 then 1 pill (250mg ) days 2-5. 04/14/20   , MD  fluconazole (DIFLUCAN) 100 MG tablet Take 1 tablet (100 mg total) by mouth daily. Take 1 tablet by mouth now repeat in 5 days Patient not taking: Reported on 04/14/2020 11/09/19   06/14/2020, FNP  guaiFENesin-codeine (ROBITUSSIN AC) 100-10 MG/5ML syrup Take 5 mLs by mouth 3 (three) times daily as needed for cough. 04/14/20   Arnette Felts, MD  nicotine (NICODERM CQ - DOSED IN MG/24 HOURS) 21 mg/24hr patch Place 1 patch (21 mg total) onto the skin daily. Patient not taking: Reported on 04/14/2020 11/09/19   06/14/2020, FNP  nystatin cream (MYCOSTATIN) Apply to affected area 2 times daily 04/14/20   Arnette Felts, MD  nystatin cream (MYCOSTATIN) Apply 1 application topically 2 (two) times daily. 04/14/20   Raeford Razor, MD  predniSONE (DELTASONE) 20 MG tablet Take 2 tablets (40 mg total) by mouth daily. 04/14/20   Raeford Razor, MD  Allergies    Latex  Review of Systems   Review of Systems All systems reviewed and negative, other than as noted in HPI.  Physical Exam Updated Vital Signs BP 114/78 (BP Location: Left Arm)   Pulse 85   Temp 99 F (37.2 C) (Oral)   Resp 16   Ht 5\' 9"  (1.753 m)   Wt 81.2 kg   LMP 04/13/2020   SpO2 98%   BMI 26.44 kg/m   Physical Exam Vitals and nursing note reviewed.  Constitutional:      General: She is not in acute distress.    Appearance: She is well-developed.  HENT:     Head: Normocephalic and atraumatic.  Eyes:     General:        Right eye: No discharge.        Left eye: No discharge.     Conjunctiva/sclera: Conjunctivae normal.  Cardiovascular:     Rate and Rhythm: Normal rate and regular rhythm.     Heart sounds: Normal heart sounds. No murmur heard.  No  friction rub. No gallop.   Pulmonary:     Effort: Pulmonary effort is normal. No respiratory distress.     Breath sounds: Wheezing present.  Abdominal:     General: There is no distension.     Palpations: Abdomen is soft.     Tenderness: There is no abdominal tenderness.  Musculoskeletal:        General: No tenderness.     Cervical back: Neck supple.  Skin:    General: Skin is warm and dry.  Neurological:     Mental Status: She is alert.  Psychiatric:        Behavior: Behavior normal.        Thought Content: Thought content normal.     ED Results / Procedures / Treatments   Labs (all labs ordered are listed, but only abnormal results are displayed) Labs Reviewed  COMPREHENSIVE METABOLIC PANEL - Abnormal; Notable for the following components:      Result Value   Sodium 134 (*)    All other components within normal limits  CBC - Abnormal; Notable for the following components:   RBC 3.86 (*)    All other components within normal limits  URINALYSIS, ROUTINE W REFLEX MICROSCOPIC  I-STAT BETA HCG BLOOD, ED ([facility], [facility], [facility] ONLY)    EKG EKG Interpretation  Date/Time:  Wednesday April 13 2020 22:51:47 EDT Ventricular Rate:  95 PR Interval:  144 QRS Duration: 70 QT Interval:  350 QTC Calculation: 439 R Axis:   69 Text Interpretation: Normal sinus rhythm Normal ECG Confirmed by [id number]) on 04/14/2020 10:37:23 PM   Radiology No results found.   DG Chest 2 View  Result Date: 04/13/2020 CLINICAL DATA:  Cold and cough.  Asthma. EXAM: CHEST - 2 VIEW COMPARISON:  01/08/2016 FINDINGS: Heart size is normal. Mild hyperinflation. Peribronchial thickening with perihilar opacities likely representing viral bronchiolitis or airways disease. No focal consolidation. No pleural effusions. No pneumothorax. Mediastinal contours appear intact. IMPRESSION: Peribronchial thickening and perihilar opacities likely representing viral bronchiolitis or airways disease. No focal  consolidation. Electronically Signed   By: 03/09/2016 M.D.   On: 04/13/2020 23:26    Procedures Procedures (including critical care time)  Medications Ordered in ED Medications  albuterol (VENTOLIN HFA) 108 (90 Base) MCG/ACT inhaler 4 puff (4 puffs Inhalation Given 04/13/20 2315)  predniSONE (DELTASONE) tablet 60 mg (60 mg Oral Given 04/14/20 1504)  ibuprofen (ADVIL) tablet 600 mg (  600 mg Oral Given 04/14/20 1503)    ED Course  I have reviewed the triage vital signs and the nursing notes.  Pertinent labs & imaging results that were available during my care of the patient were reviewed by me and considered in my medical decision making (see chart for details).    MDM Rules/Calculators/A&P                          44 year old female with cough and dyspnea.  Wheezing on exam.  Clinically asthma exacerbation.  She is provided with an albuterol inhaler.  Steroids.  Initially refilled some of her other medications per her request.  Think she is appropriate for outpatient treatment at this time.  Work of breathing is not simply increase.  Chest x-ray shows no focal infiltrate. Final Clinical Impression(s) / ED Diagnoses Final diagnoses:  Exacerbation of asthma, unspecified asthma severity, unspecified whether persistent    Rx / DC Orders ED Discharge Orders         Ordered    predniSONE (DELTASONE) 20 MG tablet  Daily        04/14/20 1436    azithromycin (ZITHROMAX Z-PAK) 250 MG tablet  Daily,   Status:  Discontinued        04/14/20 1436    guaiFENesin-codeine (ROBITUSSIN AC) 100-10 MG/5ML syrup  3 times daily PRN        04/14/20 1436    albuterol (VENTOLIN HFA) 108 (90 Base) MCG/ACT inhaler  Every 4 hours PRN,   Status:  Discontinued        04/14/20 1436    azithromycin (ZITHROMAX Z-PAK) 250 MG tablet  Daily        04/14/20 1509    albuterol (VENTOLIN HFA) 108 (90 Base) MCG/ACT inhaler  Every 4 hours PRN        04/14/20 1509    nystatin cream (MYCOSTATIN)        04/14/20 1512     nystatin cream (MYCOSTATIN)  2 times daily        04/14/20 1512           Raeford Razor, MD 04/16/20 1631

## 2020-10-14 IMAGING — CR DG CHEST 2V
2 series · 2 of 2 positions shown · non-contrast
Comparison: 01/08/2016

CLINICAL DATA: Cold and cough.  Asthma.

EXAM:
CHEST - 2 VIEW

[chest pa]
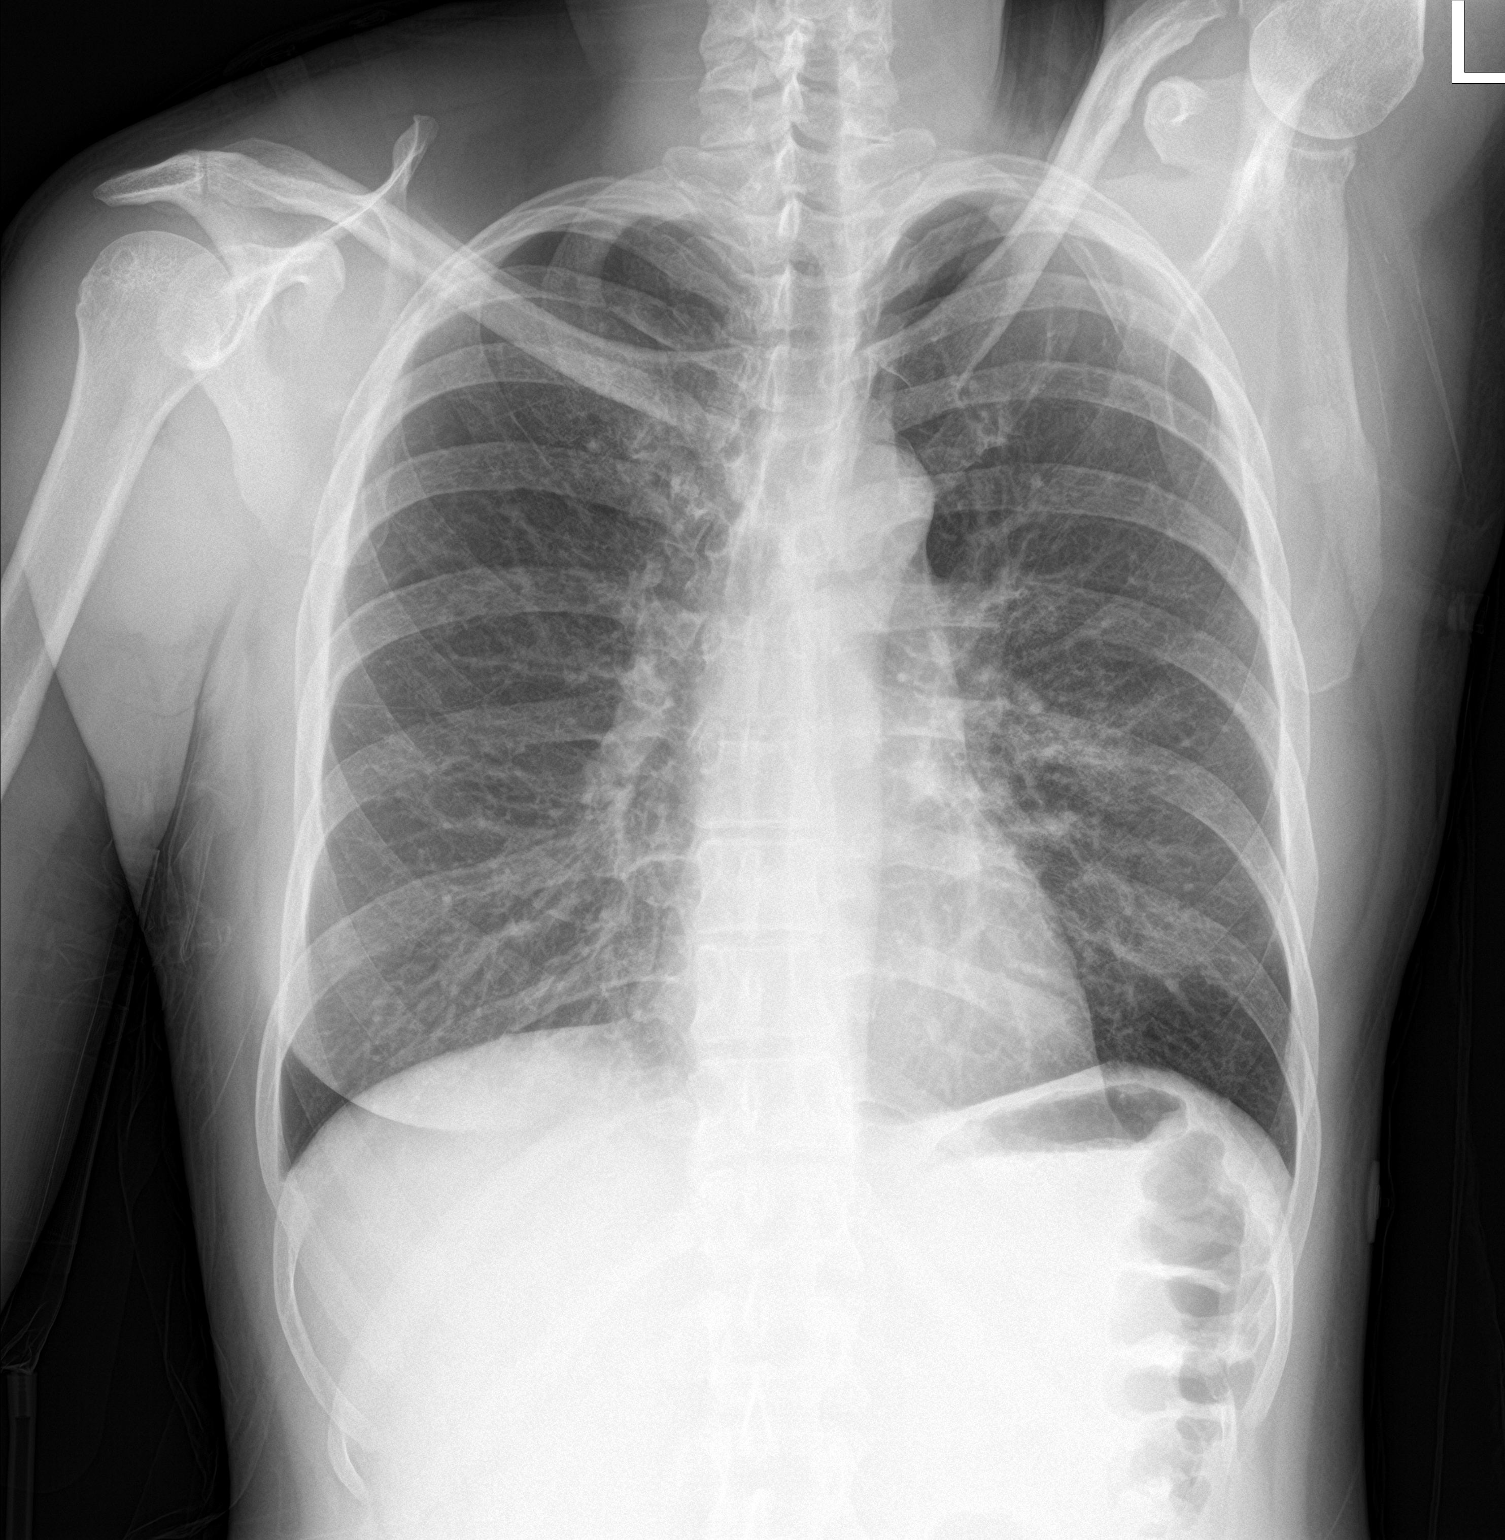

[chest lat]
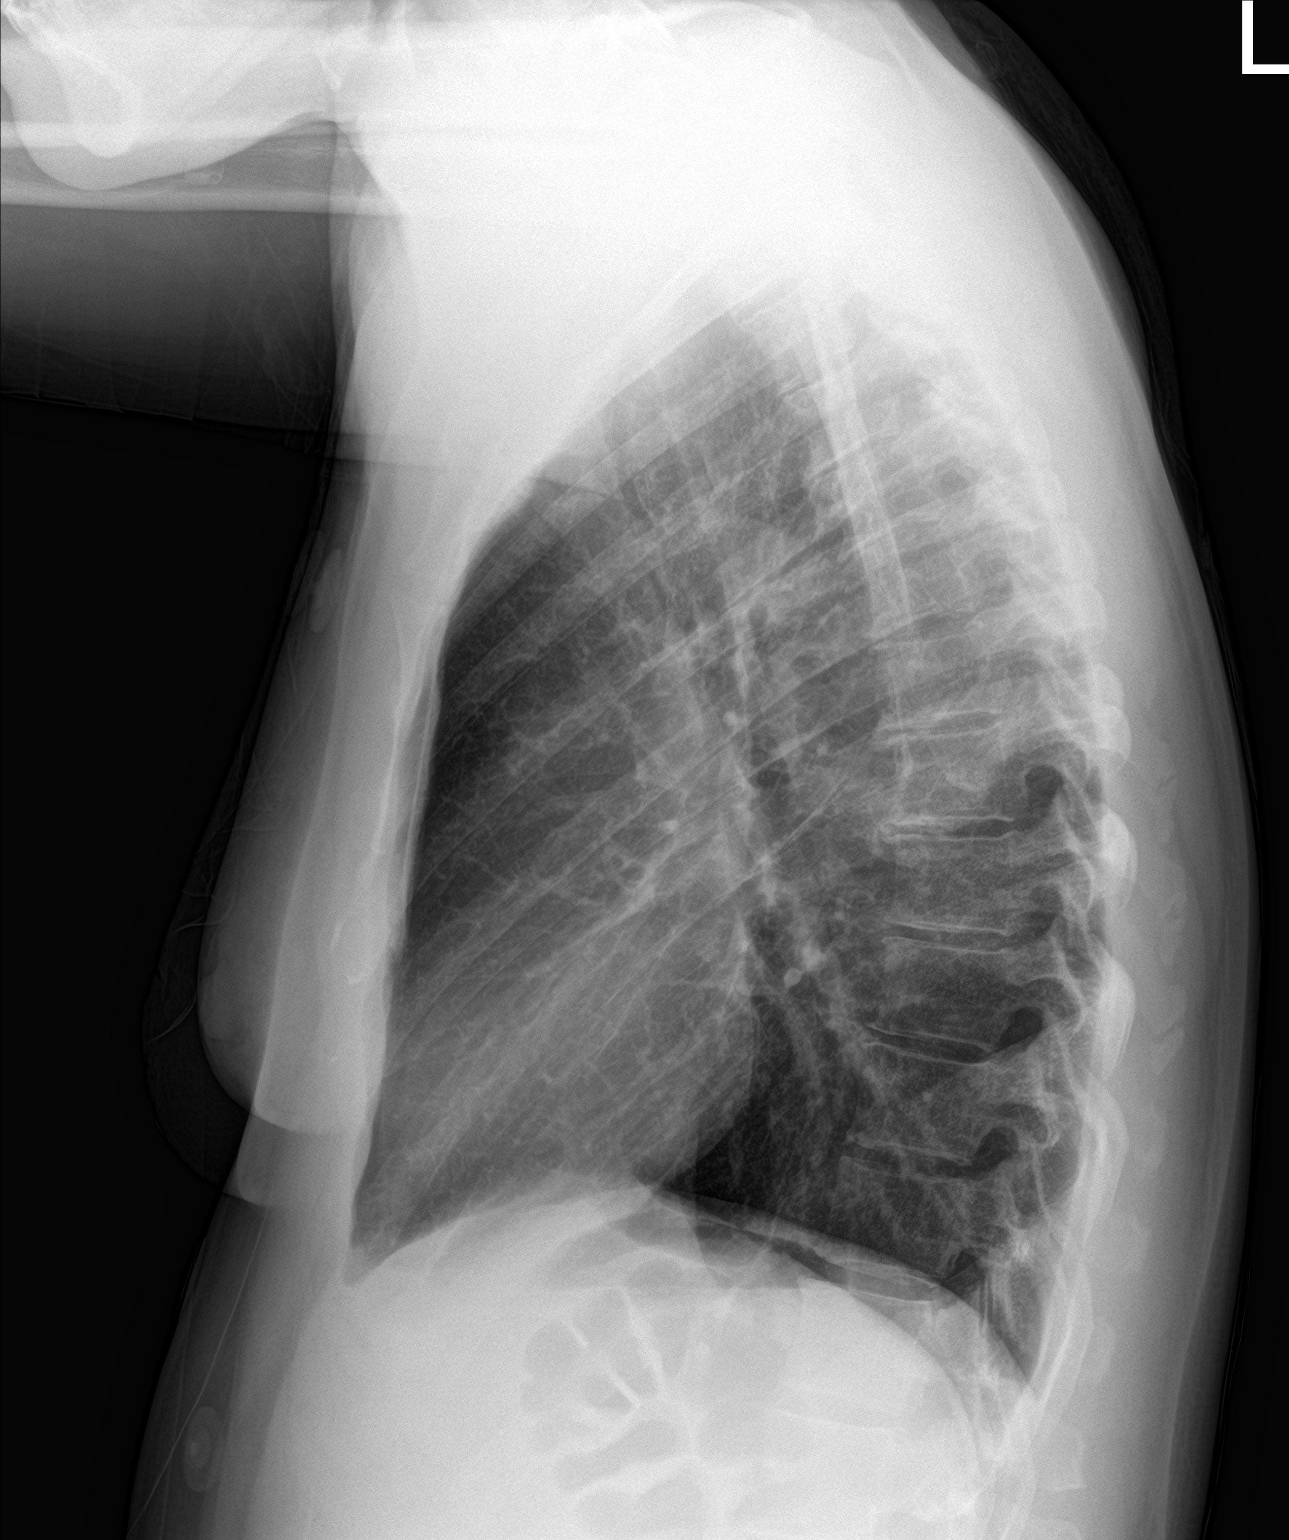

[2 of 2 positions shown; findings below may reference images not displayed]

FINDINGS: Heart size is normal. Mild hyperinflation. Peribronchial thickening
with perihilar opacities likely representing viral bronchiolitis or
airways disease. No focal consolidation. No pleural effusions. No
pneumothorax. Mediastinal contours appear intact.
IMPRESSION: Peribronchial thickening and perihilar opacities likely representing
viral bronchiolitis or airways disease. No focal consolidation.

## 2023-01-24 ENCOUNTER — Other Ambulatory Visit: Payer: Self-pay

## 2023-01-24 ENCOUNTER — Ambulatory Visit (HOSPITAL_COMMUNITY)
Admission: EM | Admit: 2023-01-24 | Discharge: 2023-01-24 | Disposition: A | Discharge status: Home / Self Care | Payer: BC Managed Care – PPO

## 2023-01-24 ENCOUNTER — Encounter (HOSPITAL_COMMUNITY): Payer: Self-pay | Admitting: Emergency Medicine

## 2023-01-24 DIAGNOSIS — T63461A Toxic effect of venom of wasps, accidental (unintentional), initial encounter: Secondary | ICD-10-CM | POA: Diagnosis not present

## 2023-01-24 DIAGNOSIS — W57XXXA Bitten or stung by nonvenomous insect and other nonvenomous arthropods, initial encounter: Secondary | ICD-10-CM

## 2023-01-24 NOTE — ED Provider Notes (Signed)
[facility]    CSN: [id number] Arrival date & time: 01/24/23  1228      History   Chief Complaint Chief Complaint  Patient presents with   Insect Bite    Pt states she got stung by a wasp this morning and her arm is swollen and she is c/o multiple bites on her body from bedbugs.    HPI Emily Kane is a 47 y.o. female.   47 year old female, Emily Kane, presents to urgent care chief complaint of being stung by wasp on the left forearm this morning, also complaining of bed bug bites and want sto know how toi get rid of bed bugs/bites.  The history is provided by the patient. No language interpreter was used.    Past Medical History:  Diagnosis Date   Asthma     Patient Active Problem List   Diagnosis Date Noted   Wasp sting 01/24/2023   Bedbug bite 01/24/2023    Past Surgical History:  Procedure Laterality Date   ROTATOR CUFF REPAIR     TUBAL LIGATION      OB History   No obstetric history on file.      Home Medications    Prior to Admission medications   Medication Sig Start Date End Date Taking? Authorizing Provider  albuterol (VENTOLIN HFA) 108 (90 Base) MCG/ACT inhaler Inhale 1-2 puffs into the lungs every 6 (six) hours as needed for wheezing or shortness of breath. 11/09/19   Arnette Felts, FNP  albuterol (VENTOLIN HFA) 108 (90 Base) MCG/ACT inhaler Inhale 1-2 puffs into the lungs every 4 (four) hours as needed for wheezing or shortness of breath. 04/14/20   Raeford Razor, MD  azithromycin (ZITHROMAX Z-PAK) 250 MG tablet Take 1 tablet (250 mg total) by mouth daily. 2 pills (500mg ) day 1 then 1 pill (250mg ) days 2-5. 04/14/20   Raeford Razor, MD  fluconazole (DIFLUCAN) 100 MG tablet Take 1 tablet (100 mg total) by mouth daily. Take 1 tablet by mouth now repeat in 5 days Patient not taking: Reported on 04/14/2020 11/09/19   Arnette Felts, FNP  guaiFENesin-codeine (ROBITUSSIN AC) 100-10 MG/5ML syrup Take 5 mLs by mouth 3 (three) times daily as needed  for cough. 04/14/20   Raeford Razor, MD  nicotine (NICODERM CQ - DOSED IN MG/24 HOURS) 21 mg/24hr patch Place 1 patch (21 mg total) onto the skin daily. Patient not taking: Reported on 04/14/2020 11/09/19   Arnette Felts, FNP  nystatin cream (MYCOSTATIN) Apply to affected area 2 times daily 04/14/20   Raeford Razor, MD  nystatin cream (MYCOSTATIN) Apply 1 application topically 2 (two) times daily. 04/14/20   Raeford Razor, MD  predniSONE (DELTASONE) 20 MG tablet Take 2 tablets (40 mg total) by mouth daily. 04/14/20   Raeford Razor, MD    Family History Family History  Problem Relation Age of Onset   Cancer Other    Diabetes Other    Healthy Mother    Asthma Father    Heart disease Father    Asthma Sister    Healthy Brother    Healthy Brother    Cancer Maternal Grandmother    Cancer Maternal Grandfather    Heart attack Paternal Grandmother    Emphysema Paternal Grandfather    Cancer Paternal Grandfather     Social History Social History   Tobacco Use   Smoking status: Every Day    Packs/day: 1    Types: Cigarettes   Smokeless tobacco: Never  Vaping Use   Vaping  Use: Never used  Substance Use Topics   Alcohol use: Yes    Alcohol/week: 5.0 standard drinks of alcohol    Types: 5 Standard drinks or equivalent per week   Drug use: Yes    Types: Marijuana     Allergies   Latex   Review of Systems Review of Systems  Constitutional:  Negative for fever.  Respiratory:  Negative for shortness of breath, wheezing and stridor.   Cardiovascular:  Negative for chest pain.  Skin:  Positive for color change and wound.  All other systems reviewed and are negative.    Physical Exam Triage Vital Signs ED Triage Vitals  Enc Vitals Group     BP      Pulse      Resp      Temp      Temp src      SpO2      Weight      Height      Head Circumference      Peak Flow      Pain Score      Pain Loc      Pain Edu?      Excl. in GC?    No data found.  Updated Vital Signs BP  109/71 (BP Location: Right Arm)   Pulse 81   Temp 98.1 F (36.7 C) (Oral)   Resp 18   SpO2 98%   Visual Acuity Right Eye Distance:   Left Eye Distance:   Bilateral Distance:    Right Eye Near:   Left Eye Near:    Bilateral Near:     Physical Exam Vitals and nursing note reviewed.  Constitutional:      General: She is not in acute distress.    Appearance: She is well-developed and well-groomed.  HENT:     Head: Normocephalic and atraumatic.  Eyes:     Conjunctiva/sclera: Conjunctivae normal.  Cardiovascular:     Rate and Rhythm: Normal rate and regular rhythm.     Pulses: Normal pulses.     Heart sounds: No murmur heard. Pulmonary:     Effort: Pulmonary effort is normal. No respiratory distress.     Breath sounds: Normal breath sounds and air entry.  Abdominal:     Palpations: Abdomen is soft.     Tenderness: There is no abdominal tenderness.  Musculoskeletal:        General: No swelling.     Cervical back: Neck supple.  Skin:    General: Skin is warm and dry.     Capillary Refill: Capillary refill takes less than 2 seconds.     Findings: Erythema and lesion present.          Comments: Patient has numerous bug bites noted on extremities consistent with bedbug bites  Neurological:     General: No focal deficit present.     Mental Status: She is alert and oriented to person, place, and time.     GCS: GCS eye subscore is 4. GCS verbal subscore is 5. GCS motor subscore is 6.  Psychiatric:        Attention and Perception: Attention normal.        Mood and Affect: Mood normal.        Speech: Speech normal.        Behavior: Behavior normal. Behavior is cooperative.      UC Treatments / Results  Labs (all labs ordered are listed, but only abnormal results are displayed) Labs Reviewed - No  data to display  EKG   Radiology No results found.  Procedures Procedures (including critical care time)  Medications Ordered in UC Medications - No data to  display  Initial Impression / Assessment and Plan / UC Course  I have reviewed the triage vital signs and the nursing notes.  Pertinent labs & imaging results that were available during my care of the patient were reviewed by me and considered in my medical decision making (see chart for details).     Ddx: Wasp sting, insect bites,bed bug bites Final Clinical Impressions(s) / UC Diagnoses   Final diagnoses:  Wasp sting, accidental or unintentional, initial encounter  Bedbug bite, initial encounter     Discharge Instructions      May take over the counter allergy med of choice(Zyrtec,allegra, claritin), take pepcid every 12 hours to help with symptoms, avoid scratching. May use topical cortisone cream to bites or calamine lotion. Avoid heat/hot water as it makes bites/rashes worse.  Call an exterminator as they are really hard to get rid of. Some other thoughts: To get rid of bedbugs, you can start by taking some steps at home:  [facility] your bedding, curtains, and clothing in hot water and dry them on the highest dryer setting. Put stuffed animals, shoes, and other items that can't be washed in the dryer and run it on high for 30 minutes or more. Use a stiff brush to scrub mattress seams to remove bedbugs and their eggs before vacuuming. Vacuum your bed and the area around it every day, including windows and molding. Afterward, put the vacuum cleaner bag in a plastic bag and place it in the garbage outdoors right away. Put a tightly woven, zippered cover on your mattressand box springs to keep bedbugs from entering or escaping. Bedbugs can live several months without feeding. So keep the cover on your mattress for at least a year. Repair cracks in plaster and glue down peeling wallpaper to get rid of places bedbugs can hide. Get rid of clutter around your bed, and move your bed away from your walls and other furniture. If you live in a hot place, you can place your things in an enclosed  bag and leave them in your car to bake in the sun. The target temperature is at least 120 F.      ED Prescriptions   None    PDMP not reviewed this encounter.   Clancy Gourd, NP 01/24/23 1649

## 2023-01-24 NOTE — ED Triage Notes (Signed)
Pt states she got stung by a wasp this morning and her arm is swollen and she is c/o multiple bites on her body from bedbugs.

## 2023-01-24 NOTE — Discharge Instructions (Addendum)
May take over the counter allergy med of choice(Zyrtec,allegra, claritin), take pepcid every 12 hours to help with symptoms, avoid scratching. May use topical cortisone cream to bites or calamine lotion. Avoid heat/hot water as it makes bites/rashes worse.  Call an exterminator as they are really hard to get rid of. Some other thoughts: To get rid of bedbugs, you can start by taking some steps at home:  [facility] [place] your bedding, curtains, and clothing in hot water and dry them on the highest dryer setting. Put stuffed animals, shoes, and other items that can't be washed in the dryer and run it on high for 30 minutes or more. Use a stiff brush to scrub mattress seams to remove bedbugs and their eggs before vacuuming. Vacuum your bed and the area around it every day, including windows and molding. Afterward, put the vacuum cleaner bag in a plastic bag and place it in the garbage outdoors right away. Put a tightly woven, zippered cover on your mattressand box springs to keep bedbugs from entering or escaping. Bedbugs can live several months without feeding. So keep the cover on your mattress for at least a year. Repair cracks in plaster and glue down peeling wallpaper to get rid of places bedbugs can hide. Get rid of clutter around your bed, and move your bed away from your walls and other furniture. If you live in a hot place, you can place your things in an enclosed bag and leave them in your car to bake in the sun. The target temperature is at least 120 F.

## 2023-06-10 ENCOUNTER — Ambulatory Visit (HOSPITAL_COMMUNITY)
Admission: EM | Admit: 2023-06-10 | Discharge: 2023-06-10 | Disposition: A | Discharge status: Home / Self Care | Payer: BC Managed Care – PPO | Attending: Family Medicine | Admitting: Family Medicine

## 2023-06-10 ENCOUNTER — Encounter (HOSPITAL_COMMUNITY): Payer: Self-pay

## 2023-06-10 ENCOUNTER — Telehealth (HOSPITAL_COMMUNITY): Payer: Self-pay

## 2023-06-10 DIAGNOSIS — L249 Irritant contact dermatitis, unspecified cause: Secondary | ICD-10-CM | POA: Diagnosis not present

## 2023-06-10 DIAGNOSIS — R590 Localized enlarged lymph nodes: Secondary | ICD-10-CM

## 2023-06-10 MED ORDER — DOXYCYCLINE HYCLATE 100 MG PO CAPS: 100.0000 mg | ORAL_CAPSULE | Freq: Two times a day (BID) | ORAL | 0 refills | Status: DC

## 2023-06-10 MED ORDER — TRIAMCINOLONE ACETONIDE 0.1 % EX CREA: 1.0000 | TOPICAL_CREAM | Freq: Two times a day (BID) | CUTANEOUS | 0 refills | Status: AC | PRN

## 2023-06-10 MED ORDER — PREDNISONE 20 MG PO TABS: 20.0000 mg | ORAL_TABLET | Freq: Every day | ORAL | 0 refills | Status: DC

## 2023-06-10 MED ORDER — PREDNISONE 20 MG PO TABS: 20.0000 mg | ORAL_TABLET | Freq: Every day | ORAL | 0 refills | Status: AC

## 2023-06-10 MED ORDER — DEXAMETHASONE SODIUM PHOSPHATE 10 MG/ML IJ SOLN
INTRAMUSCULAR | Status: AC
  Filled 2023-06-10: qty 1

## 2023-06-10 MED ORDER — TRIAMCINOLONE ACETONIDE 0.1 % EX CREA: 1.0000 | TOPICAL_CREAM | Freq: Two times a day (BID) | CUTANEOUS | 0 refills | Status: DC | PRN

## 2023-06-10 MED ORDER — DEXAMETHASONE SODIUM PHOSPHATE 10 MG/ML IJ SOLN
10.0000 mg | Freq: Once | INTRAMUSCULAR | Status: AC
  Administered 2023-06-10: 10 mg via INTRAMUSCULAR

## 2023-06-10 NOTE — ED Triage Notes (Signed)
Pt presents to the office with rash all over her body x 1 months. Pt reports itching and red spots. Pt states a knot appeared on her neck today.
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
## 2023-06-10 NOTE — Telephone Encounter (Signed)
Patient requested that her prescriptions be sent to [facility] on [address].

## 2023-06-10 NOTE — Discharge Instructions (Addendum)
Schedule a follow-up with PCP regarding enlarged lymph nodes.  Take your medications as prescribed. I have prescribed to a topical cream to help with the itching as well.  Launder all of your items in your home as I am unable to specifically say what is causing the rash eruption.

## 2023-07-07 ENCOUNTER — Encounter (HOSPITAL_COMMUNITY): Payer: Self-pay

## 2023-07-07 ENCOUNTER — Emergency Department (HOSPITAL_COMMUNITY)
Admission: EM | Admit: 2023-07-07 | Discharge: 2023-07-07 | Disposition: A | Discharge status: Home / Self Care | Payer: BC Managed Care – PPO | Attending: Emergency Medicine | Admitting: Emergency Medicine

## 2023-07-07 ENCOUNTER — Other Ambulatory Visit: Payer: Self-pay

## 2023-07-07 DIAGNOSIS — Z91040 Latex allergy status: Secondary | ICD-10-CM | POA: Diagnosis not present

## 2023-07-07 DIAGNOSIS — R21 Rash and other nonspecific skin eruption: Secondary | ICD-10-CM | POA: Diagnosis present

## 2023-07-07 LAB — HIV ANTIBODY (ROUTINE TESTING W REFLEX): HIV Screen 4th Generation wRfx: NONREACTIVE

## 2023-07-07 MED ORDER — PENICILLIN G BENZATHINE 1200000 UNIT/2ML IM SUSY
2.4000 10*6.[IU] | PREFILLED_SYRINGE | Freq: Once | INTRAMUSCULAR | Status: AC
  Administered 2023-07-07: 2.4 10*6.[IU] via INTRAMUSCULAR
  Filled 2023-07-07: qty 4

## 2023-07-07 MED ORDER — DOXYCYCLINE HYCLATE 100 MG PO CAPS: 100.0000 mg | ORAL_CAPSULE | Freq: Two times a day (BID) | ORAL | 0 refills | Status: AC

## 2023-07-07 MED ORDER — DOXYCYCLINE HYCLATE 100 MG PO TABS
100.0000 mg | ORAL_TABLET | Freq: Once | ORAL | Status: AC
  Administered 2023-07-07: 100 mg via ORAL
  Filled 2023-07-07: qty 1

## 2023-07-07 NOTE — Discharge Instructions (Addendum)
I am going to treat you with antibiotics. Please follow up with a family doc in the office.  There is a hotline number in the paperwork to try and call and get a family doc.   Take a look at what you put on your arms and legs....you might want to go to perfume and dye free soaps and detergents.

## 2023-07-07 NOTE — ED Triage Notes (Signed)
Pt came in via POV d/t feeling like she has an infection in her body. Reports having blisters develop in different areas on her body. Currently has some on her hands wrist & ankle. Has been seen at [facility] for ABT & prednisone for this & states it did help dry them up some but they are coming back. A/Ox4, reports 4/10 pain in the areas, no recent fevers but feels like she has one in triage, 97.6, 97.6 upon arrival.

## 2023-07-07 NOTE — ED Provider Notes (Signed)
[place] EMERGENCY DEPARTMENT AT [facility] Provider Note   CSN: [id number] Arrival date & time: 07/07/23  1400     History  Chief Complaint  Patient presents with   Blisters on body    Emily Kane is a 47 y.o. female.  47 yo F with a chief complaints of a rash to her arms and legs.  This been going on for weeks.  She had been seen in urgent care and was started on steroids.  She does not think that it helped at all.  Still is getting new lesions.  Denies fevers or chills.  Denies any new soaps or detergents or perfumes.  She states that it is itchy.  Denies any significant areas to the chest abdomen or back.        Home Medications Prior to Admission medications   Medication Sig Start Date End Date Taking? Authorizing Provider  doxycycline (VIBRAMYCIN) 100 MG capsule Take 1 capsule (100 mg total) by mouth 2 (two) times daily. One po bid x 7 days 07/07/23  Yes Melene Plan, DO  albuterol (VENTOLIN HFA) 108 (90 Base) MCG/ACT inhaler Inhale 1-2 puffs into the lungs every 6 (six) hours as needed for wheezing or shortness of breath. 11/09/19   Arnette Felts, FNP  albuterol (VENTOLIN HFA) 108 (90 Base) MCG/ACT inhaler Inhale 1-2 puffs into the lungs every 4 (four) hours as needed for wheezing or shortness of breath. 04/14/20   Raeford Razor, MD  azithromycin (ZITHROMAX Z-PAK) 250 MG tablet Take 1 tablet (250 mg total) by mouth daily. 2 pills (500mg ) day 1 then 1 pill (250mg ) days 2-5. 04/14/20   Raeford Razor, MD  fluconazole (DIFLUCAN) 100 MG tablet Take 1 tablet (100 mg total) by mouth daily. Take 1 tablet by mouth now repeat in 5 days Patient not taking: Reported on 04/14/2020 11/09/19   Arnette Felts, FNP  guaiFENesin-codeine (ROBITUSSIN AC) 100-10 MG/5ML syrup Take 5 mLs by mouth 3 (three) times daily as needed for cough. 04/14/20   Raeford Razor, MD  nicotine (NICODERM CQ - DOSED IN MG/24 HOURS) 21 mg/24hr patch Place 1 patch (21 mg total) onto the skin daily. Patient not  taking: Reported on 04/14/2020 11/09/19   Arnette Felts, FNP  nystatin cream (MYCOSTATIN) Apply to affected area 2 times daily 04/14/20   Raeford Razor, MD  nystatin cream (MYCOSTATIN) Apply 1 application topically 2 (two) times daily. 04/14/20   Raeford Razor, MD  triamcinolone cream (KENALOG) 0.1 % Apply 1 Application topically 2 (two) times daily as needed (skin irritation). 06/10/23   Bing Neighbors, NP      Allergies    Latex    Review of Systems   Review of Systems  Physical Exam Updated Vital Signs BP 132/84   Pulse 87   Temp 97.6 F (36.4 C)   Resp 16   Ht 5\' 9"  (1.753 m)   Wt 68 kg   LMP 05/31/2023 (Approximate)   SpO2 99%   BMI 22.15 kg/m  Physical Exam Vitals and nursing note reviewed.  Constitutional:      General: She is not in acute distress.    Appearance: She is well-developed. She is not diaphoretic.  HENT:     Head: Normocephalic and atraumatic.  Eyes:     Pupils: Pupils are equal, round, and reactive to light.  Cardiovascular:     Rate and Rhythm: Normal rate and regular rhythm.     Heart sounds: No murmur heard.    No friction  rub. No gallop.  Pulmonary:     Effort: Pulmonary effort is normal.     Breath sounds: No wheezing or rales.  Abdominal:     General: There is no distension.     Palpations: Abdomen is soft.     Tenderness: There is no abdominal tenderness.  Musculoskeletal:        General: No tenderness.     Cervical back: Normal range of motion and neck supple.     Comments: Multiple Keratonotic  nodules mostly to the arms and legs with some excoriations.  There is a small amount of the low part of the back.  Seems to spare the trunk otherwise.  Skin:    General: Skin is warm and dry.  Neurological:     Mental Status: She is alert and oriented to person, place, and time.  Psychiatric:        Behavior: Behavior normal.     ED Results / Procedures / Treatments   Labs (all labs ordered are listed, but only abnormal results are  displayed) Labs Reviewed  RPR  HIV ANTIBODY (ROUTINE TESTING W REFLEX)  GC/CHLAMYDIA PROBE AMP ([place]) NOT AT [facility]    EKG None  Radiology No results found.  Procedures Procedures    Medications Ordered in ED Medications  penicillin g benzathine (BICILLIN LA) 1200000 UNIT/2ML injection 2.4 Million Units (has no administration in time range)  doxycycline (VIBRA-TABS) tablet 100 mg (has no administration in time range)    ED Course/ Medical Decision Making/ A&P                                 Medical Decision Making Amount and/or Complexity of Data Reviewed Labs: ordered.  Risk Prescription drug management.   47 yo F with a chief complaint of rash to her arms and legs.  I am not sure the cause of this rash.  Seems itchy.  Could be due to insect bites or some sort of contact as it is mostly on the arms and legs.  I will treat her for syphilis and send off labs for syphilis HIV and gonorrhea and chlamydia.  Treat with doxycycline as well.  Encouraged PCP and dermatology follow-up.  3:59 PM:  I have discussed the diagnosis/risks/treatment options with the patient.  Evaluation and diagnostic testing in the emergency department does not suggest an emergent condition requiring admission or immediate intervention beyond what has been performed at this time.  They will follow up with PCP. We also discussed returning to the ED immediately if new or worsening sx occur. We discussed the sx which are most concerning (e.g., sudden worsening pain, fever, inability to tolerate by mouth) that necessitate immediate return. Medications administered to the patient during their visit and any new prescriptions provided to the patient are listed below.  Medications given during this visit Medications  penicillin g benzathine (BICILLIN LA) 1200000 UNIT/2ML injection 2.4 Million Units (has no administration in time range)  doxycycline (VIBRA-TABS) tablet 100 mg (has no administration in time  range)     The patient appears reasonably screen and/or stabilized for discharge and I doubt any other medical condition or other [facility] requiring further screening, evaluation, or treatment in the ED at this time prior to discharge.          Final Clinical Impression(s) / ED Diagnoses Final diagnoses:  Rash and nonspecific skin eruption    Rx / DC Orders ED  Discharge Orders          Ordered    doxycycline (VIBRAMYCIN) 100 MG capsule  2 times daily        07/07/23 1545              Melene Plan, [place] 07/07/23 1559

## 2023-07-08 LAB — RPR: RPR Ser Ql: NONREACTIVE

## 2023-07-08 LAB — GC/CHLAMYDIA PROBE AMP (~~LOC~~) NOT AT ARMC
Chlamydia: NEGATIVE
Comment: NEGATIVE
Comment: NORMAL
Neisseria Gonorrhea: NEGATIVE
